# Patient Record
Sex: Female | Born: 1995 | Race: Black or African American | Hispanic: No | Marital: Single | Smoking: Never smoker
Health system: Southern US, Community
[De-identification: ages and names within clinical notes are randomized; demographics above are authoritative.]

## PROBLEM LIST (undated history)

## (undated) DIAGNOSIS — K5190 Ulcerative colitis, unspecified, without complications: Secondary | ICD-10-CM

## (undated) DIAGNOSIS — K5090 Crohn's disease, unspecified, without complications: Secondary | ICD-10-CM

## (undated) DIAGNOSIS — R519 Headache, unspecified: Secondary | ICD-10-CM

## (undated) DIAGNOSIS — R51 Headache: Secondary | ICD-10-CM

## (undated) DIAGNOSIS — G43909 Migraine, unspecified, not intractable, without status migrainosus: Secondary | ICD-10-CM

---
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
## 2016-02-22 ENCOUNTER — Encounter (HOSPITAL_BASED_OUTPATIENT_CLINIC_OR_DEPARTMENT_OTHER): Payer: Self-pay | Admitting: Emergency Medicine

## 2016-03-06 ENCOUNTER — Emergency Department (HOSPITAL_BASED_OUTPATIENT_CLINIC_OR_DEPARTMENT_OTHER): Payer: Medicaid Other

## 2016-03-06 ENCOUNTER — Emergency Department (HOSPITAL_BASED_OUTPATIENT_CLINIC_OR_DEPARTMENT_OTHER)
Admission: EM | Admit: 2016-03-06 | Discharge: 2016-03-06 | Disposition: A | Discharge status: Home / Self Care | Payer: Medicaid Other | Attending: Emergency Medicine | Admitting: Emergency Medicine

## 2016-03-06 ENCOUNTER — Encounter (HOSPITAL_BASED_OUTPATIENT_CLINIC_OR_DEPARTMENT_OTHER): Payer: Self-pay | Admitting: Emergency Medicine

## 2016-03-06 DIAGNOSIS — J0100 Acute maxillary sinusitis, unspecified: Secondary | ICD-10-CM | POA: Insufficient documentation

## 2016-03-06 DIAGNOSIS — R05 Cough: Secondary | ICD-10-CM | POA: Diagnosis present

## 2016-03-06 LAB — PREGNANCY, URINE: Preg Test, Ur: NEGATIVE

## 2016-03-06 IMAGING — DX DG CHEST 2V
2 series · 2 of 2 positions shown · non-contrast
Comparison: None.

CLINICAL DATA: Cough for 4 days

EXAM:
CHEST  2 VIEW

[chest pa]
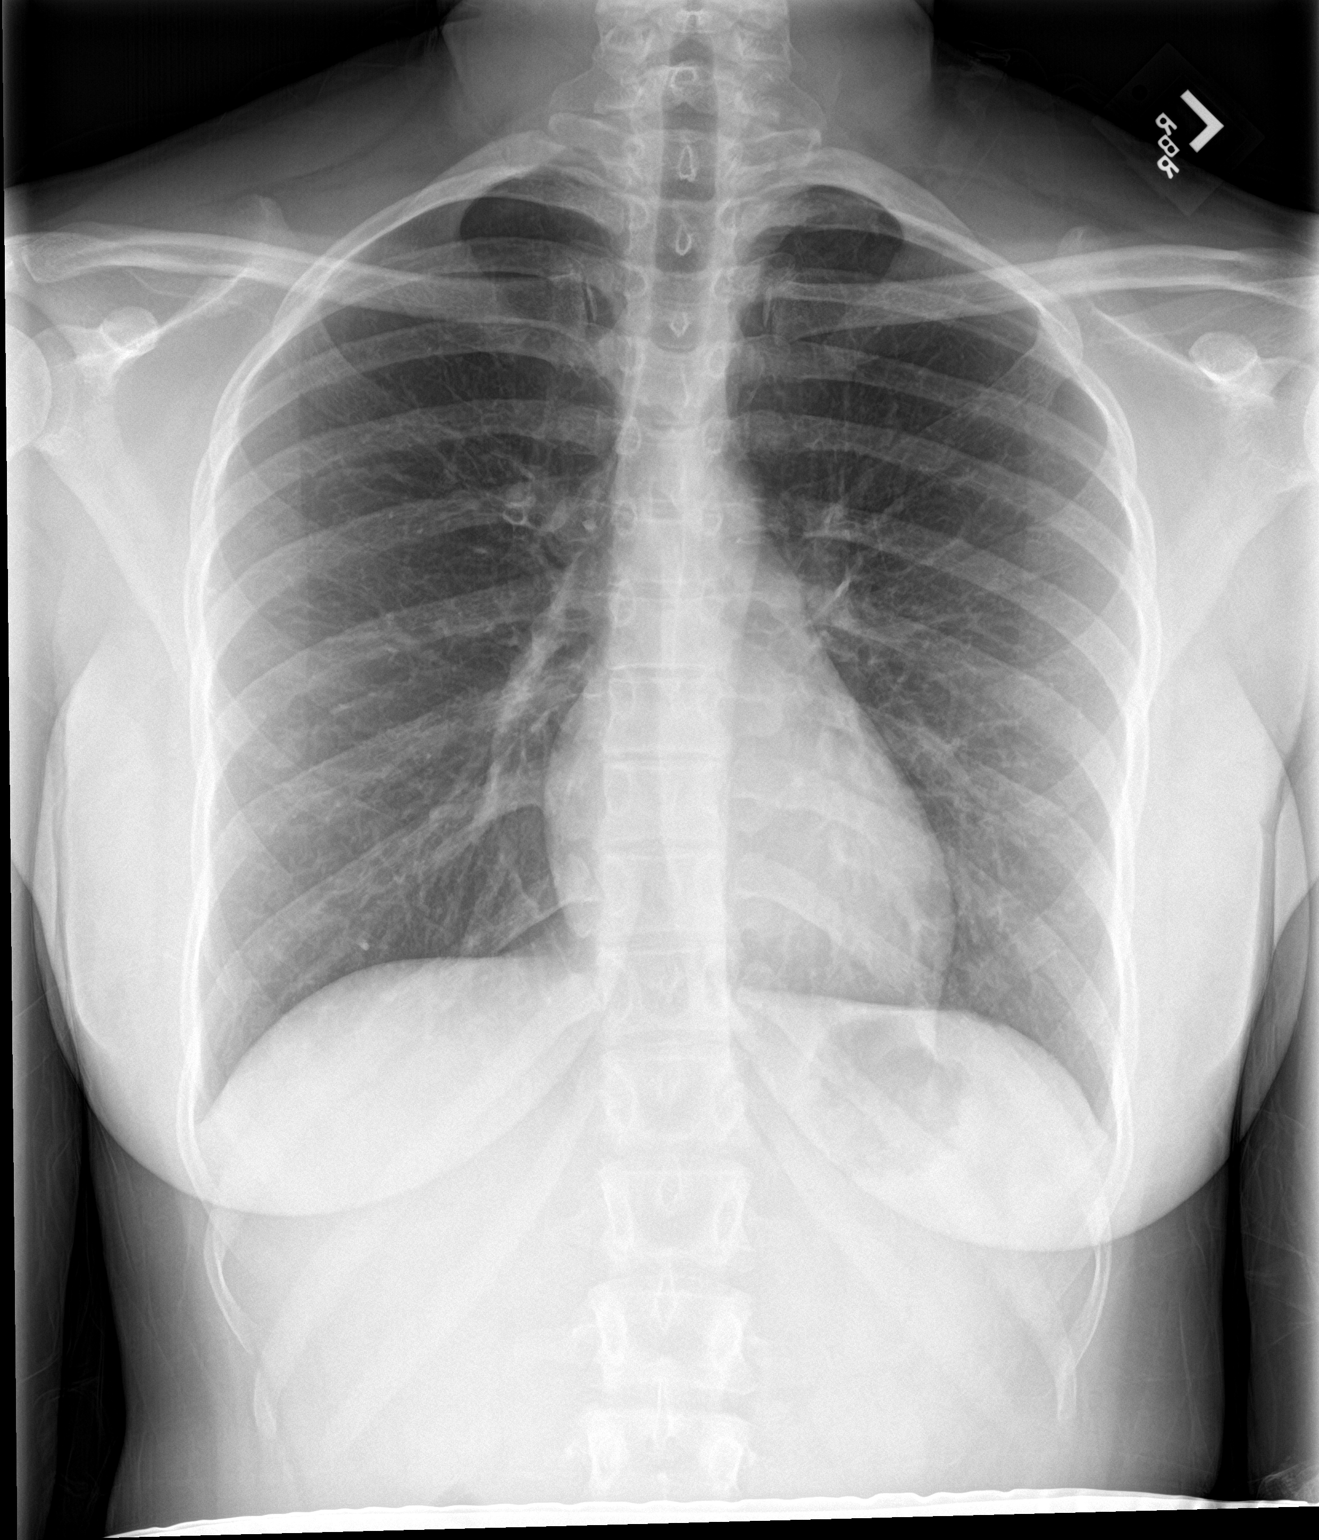

[chest lat]
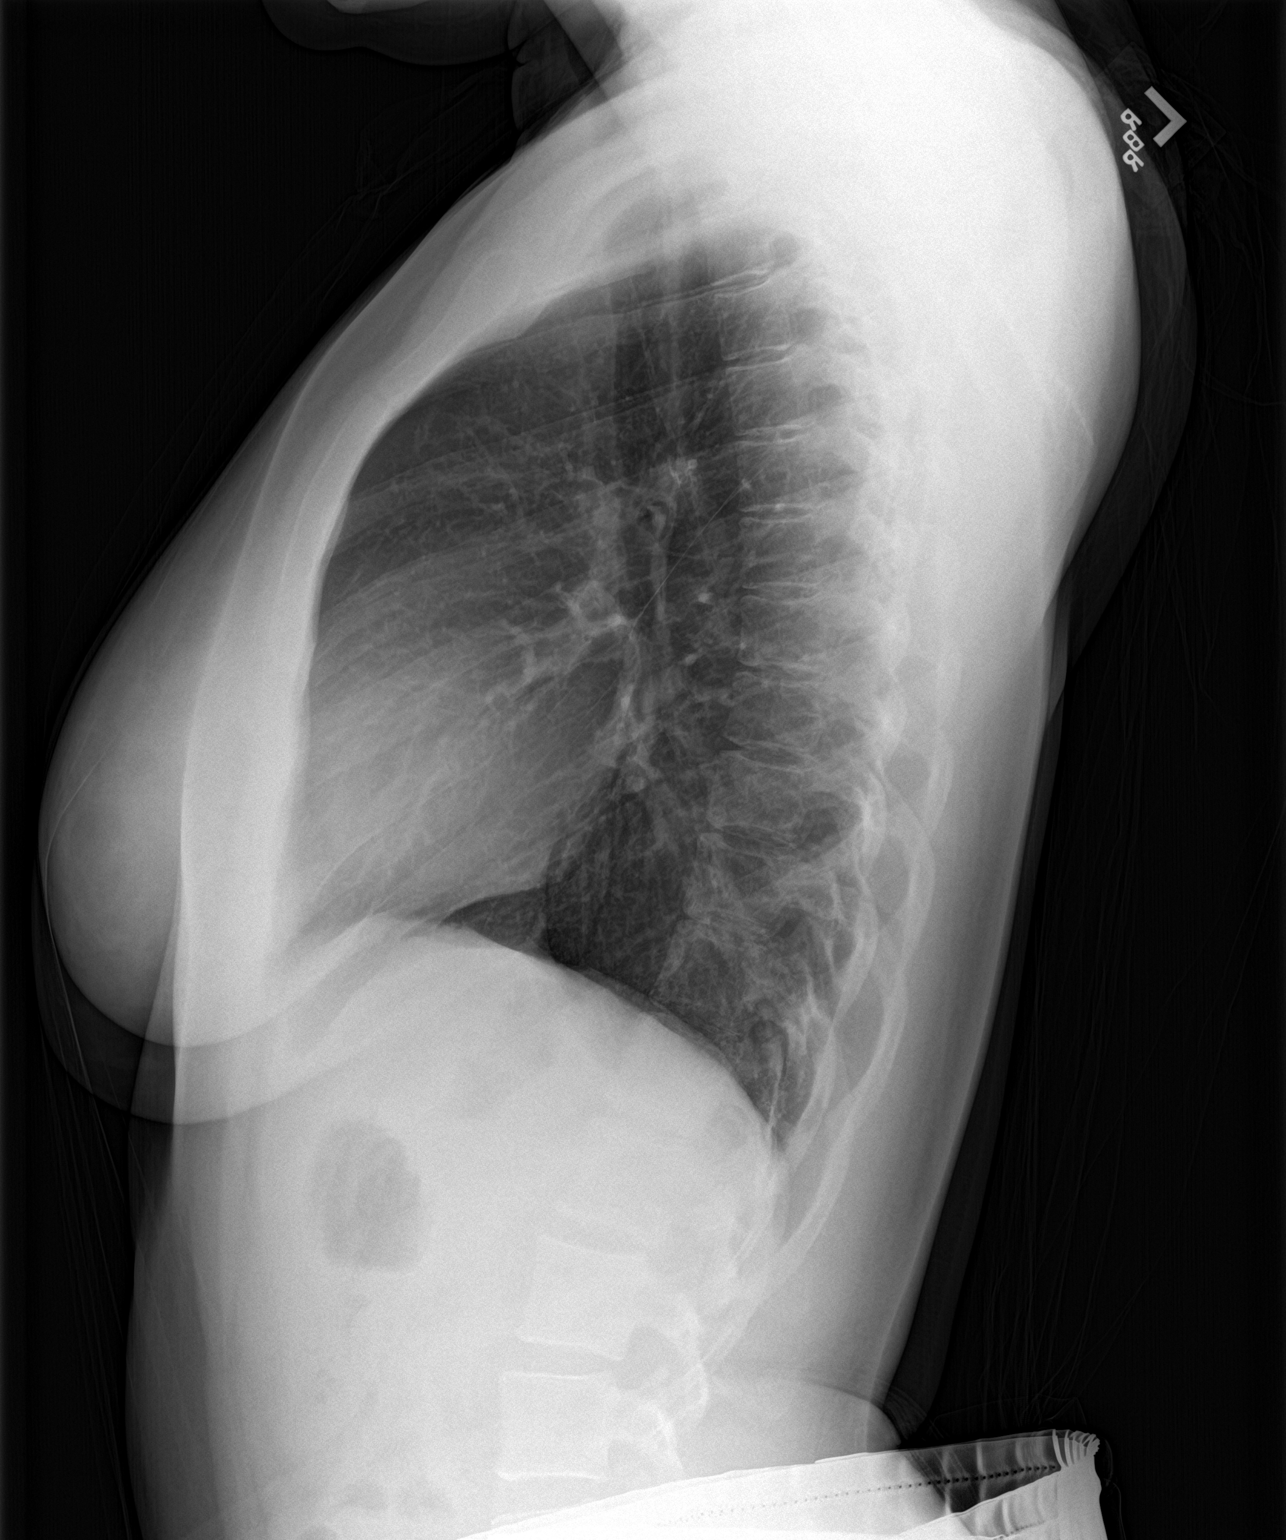

[2 of 2 positions shown; findings below may reference images not displayed]

FINDINGS: Lungs are clear. Heart size and pulmonary vascularity are normal. No
adenopathy. No bone lesions.
IMPRESSION: No edema or consolidation.

## 2016-03-06 MED ORDER — CEPHALEXIN 500 MG PO CAPS: 500.0000 mg | ORAL_CAPSULE | Freq: Three times a day (TID) | ORAL | 0 refills | Status: DC

## 2016-03-06 NOTE — ED Provider Notes (Signed)
[facility] DEPT [facility] Provider Note   CSN: [id number] Arrival date & time: 03/06/16  1126     History   Chief Complaint Chief Complaint  Patient presents with  . Cough    HPI Robin Fuller is a 20 y.o. female history Crohn's disease but not on any medicines here presenting with cough, congestion, sinus drainage. Patient states that she has been having greenish sinus drainage and sinus pressure for the last several days. Has occasional headaches is worse at night. She states that her family sick with similar symptoms as well. Denies any fevers or chills. Also has some productive cough as well. She is not using birth control and doesn't know if she is pregnant or not but denies any abdominal pain or urinary symptoms.   The history is provided by the patient.    Past Medical History:  Diagnosis Date  . Crohn's disease ([place])   . Ulcerative colitis ([place])     There are no active problems to display for this patient.   No past surgical history on file.  OB History    No data available       Home Medications    Prior to Admission medications   Not on File    Family History No family history on file.  Social History Social History  Substance Use Topics  . Smoking status: Never Smoker  . Smokeless tobacco: Never Used  . Alcohol use No     Allergies   Patient has no known allergies.   Review of Systems Review of Systems  Respiratory: Positive for cough.   All other systems reviewed and are negative.    Physical Exam Updated Vital Signs BP 123/76 (BP Location: Right Arm)   Pulse 97   Temp 98 F (36.7 C) (Oral)   Resp 16   Ht 5' 4"  (1.626 m)   Wt 132 lb 8 oz (60.1 kg)   SpO2 99%   BMI 22.74 kg/m   Physical Exam  Constitutional: She is oriented to person, place, and time. She appears well-developed.  HENT:  Head: Normocephalic.  + maxillary sinus tenderness.   Eyes: Pupils are equal, round, and reactive to light.  Neck: Normal  range of motion. Neck supple.  Cardiovascular: Normal rate.   Pulmonary/Chest: Effort normal and breath sounds normal. No respiratory distress. She has no wheezes.  Abdominal: Soft. Bowel sounds are normal. She exhibits no distension. There is no tenderness.  Musculoskeletal: Normal range of motion.  Neurological: She is alert and oriented to person, place, and time. No cranial nerve deficit. Coordination normal.  Skin: Skin is warm.  Psychiatric: She has a normal mood and affect.  Nursing note and vitals reviewed.    ED Treatments / Results  Labs (all labs ordered are listed, but only abnormal results are displayed) Labs Reviewed  PREGNANCY, URINE    EKG  EKG Interpretation None       Radiology Dg Chest 2 View  Result Date: 03/06/2016 CLINICAL DATA:  Cough for 4 days EXAM: CHEST  2 VIEW COMPARISON:  None. FINDINGS: Lungs are clear. Heart size and pulmonary vascularity are normal. No adenopathy. No bone lesions. IMPRESSION: No edema or consolidation. Electronically Signed   By: Lowella Grip III M.D.   On: 03/06/2016 12:35    Procedures Procedures (including critical care time)  Medications Ordered in ED Medications - No data to display   Initial Impression / Assessment and Plan / ED Course  I have reviewed the triage vital signs  and the nursing notes.  Pertinent labs & imaging results that were available during my care of the patient were reviewed by me and considered in my medical decision making (see chart for details).  Clinical Course     Robin Fuller is a 20 y.o. female here with sinus congestion, cough. Vitals stable. Well appearing. Likely sinusitis vs pneumonia. Will get CXR.   1:20 PM CXR nl. UCG neg. She has medicaid so will give keflex for sinusitis.      Final Clinical Impressions(s) / ED Diagnoses   Final diagnoses:  None    New Prescriptions New Prescriptions   No medications on file     Drenda Freeze, MD 03/06/16  1320

## 2016-03-06 NOTE — ED Triage Notes (Signed)
Pt states she has uri symptoms since this weekend, irritation of ears and throat.  Some nausea.  No vomiting.  No diarrhea.  No fever.  Productive green sputum.

## 2016-03-06 NOTE — ED Notes (Signed)
Pt verbalized understanding of discharge instructions and denies any further questions at this time.   

## 2016-03-06 NOTE — Discharge Instructions (Signed)
Take keflex three times daily for a week.   Take tylenol, motrin for pain or fever.  See your doctor.   Return to ER if you have worse congestion, fever, cough.

## 2016-06-10 ENCOUNTER — Encounter (HOSPITAL_BASED_OUTPATIENT_CLINIC_OR_DEPARTMENT_OTHER): Payer: Self-pay | Admitting: *Deleted

## 2016-06-10 ENCOUNTER — Emergency Department (HOSPITAL_BASED_OUTPATIENT_CLINIC_OR_DEPARTMENT_OTHER)
Admission: EM | Admit: 2016-06-10 | Discharge: 2016-06-10 | Disposition: A | Discharge status: Home / Self Care | Payer: Medicaid Other | Attending: Emergency Medicine | Admitting: Emergency Medicine

## 2016-06-10 DIAGNOSIS — G43909 Migraine, unspecified, not intractable, without status migrainosus: Secondary | ICD-10-CM | POA: Insufficient documentation

## 2016-06-10 HISTORY — DX: Headache, unspecified: R51.9

## 2016-06-10 HISTORY — DX: Migraine, unspecified, not intractable, without status migrainosus: G43.909

## 2016-06-10 HISTORY — DX: Headache: R51

## 2016-06-10 MED ORDER — METOCLOPRAMIDE HCL 10 MG PO TABS
10.0000 mg | ORAL_TABLET | Freq: Once | ORAL | Status: AC
  Administered 2016-06-10: 10 mg via ORAL
  Filled 2016-06-10: qty 1

## 2016-06-10 MED ORDER — DEXAMETHASONE SODIUM PHOSPHATE 10 MG/ML IJ SOLN
10.0000 mg | Freq: Once | INTRAMUSCULAR | Status: AC
  Administered 2016-06-10: 10 mg via INTRAMUSCULAR
  Filled 2016-06-10: qty 1

## 2016-06-10 NOTE — ED Triage Notes (Addendum)
Here for "migraines". Gets both HAs and migraines. This migraine described as "head pumping and light sensitive, with intermittent nausea", onset this morning while driving to work, denies other sx, no meds PTA. States, "has Crohn's disease and therefore cannot have OTC meds".Alert, NAD, calm, interactive, resps e/u, speaking in clear complete sentences, no dyspnea noted, skin W&D. Family at [facility]. 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

## 2016-06-10 NOTE — ED Notes (Signed)
ED Provider at bedside. 

## 2016-06-10 NOTE — ED Provider Notes (Signed)
[facility] Provider Note   CSN: [id number] Arrival date & time: 06/10/16  2042   By signing my name below, I, Freida Busman, attest that this documentation has been prepared under the direction and in the presence of Tomasita Crumble, MD . Electronically Signed: Freida Busman, Scribe. 06/10/2016. 11:18 PM. History   Chief Complaint Chief Complaint  Patient presents with  . Migraine    The history is provided by the patient. No language interpreter was used.    HPI Comments:  Robin Fuller is a 21 y.o. female with a history of migraines, who presents to the Emergency Department complaining of a HA with pain behind her R eye which she has had for several hours. Her pain began while driving. Pt also notes her pain was preceded by nausea and symptoms today are typical of her migraine HAs. Her pain is exacerbated by light. She denies weakness in her extremities. No head injury/trauma. No alleviating factors noted. She states she has been evaluated in the past at [facility] and states they gave her "a shot of hydrocodone in her butt" which provided relief.  Past Medical History:  Diagnosis Date  . Crohn's disease ([facility])   . Frequent headaches   . Migraine   . Ulcerative colitis ([facility])     There are no active problems to display for this patient.   History reviewed. No pertinent surgical history.  OB History    No data available       Home Medications    Prior to Admission medications   Medication Sig Start Date End Date Taking? Authorizing Provider  cephALEXin (KEFLEX) 500 MG capsule Take 1 capsule (500 mg total) by mouth 3 (three) times daily. 03/06/16   Charlynne Pander, MD    Family History History reviewed. No pertinent family history.  Social History Social History  Substance Use Topics  . Smoking status: Never Smoker  . Smokeless tobacco: Never Used  . Alcohol use No     Allergies   Patient has no known allergies.   Review of  Systems Review of Systems 10 systems reviewed and all are negative for acute change except as noted in the HPI.   Physical Exam Updated Vital Signs BP 118/66 (BP Location: Left Arm)   Pulse 80   Temp 98.5 F (36.9 C) (Oral)   Resp 18   Ht 5\' 4"  (1.626 m)   Wt 135 lb (61.2 kg)   SpO2 99%   BMI 23.17 kg/m   Physical Exam  Constitutional: She is oriented to person, place, and time. She appears well-developed and well-nourished. No distress.  HENT:  Head: Normocephalic and atraumatic.  Nose: Nose normal.  Mouth/Throat: Oropharynx is clear and moist. No oropharyngeal exudate.  Eyes: Conjunctivae and EOM are normal. Pupils are equal, round, and reactive to light. No scleral icterus.  Neck: Normal range of motion. Neck supple. No JVD present. No tracheal deviation present. No thyromegaly present.  Cardiovascular: Normal rate, regular rhythm and normal heart sounds.  Exam reveals no gallop and no friction rub.   No murmur heard. Pulmonary/Chest: Effort normal and breath sounds normal. No respiratory distress. She has no wheezes. She exhibits no tenderness.  Abdominal: Soft. Bowel sounds are normal. She exhibits no distension and no mass. There is no tenderness. There is no rebound and no guarding.  Musculoskeletal: Normal range of motion. She exhibits no edema or tenderness.  Lymphadenopathy:    She has no cervical adenopathy.  Neurological: She is alert  and oriented to person, place, and time. No cranial nerve deficit. She exhibits normal muscle tone.  Normal strength and sensation to all extremities Normal cerebellar testing  Skin: Skin is warm and dry. No rash noted. No erythema. No pallor.  Nursing note and vitals reviewed.    ED Treatments / Results  DIAGNOSTIC STUDIES:  Oxygen Saturation is 99% on TA, normal by my interpretation.    COORDINATION OF CARE:  11:17 PM Discussed treatment plan with pt at bedside and pt agreed to plan.  Labs (all labs ordered are listed,  but only abnormal results are displayed) Labs Reviewed - No data to display  EKG  EKG Interpretation None       Radiology No results found.  Procedures Procedures (including critical care time)  Medications Ordered in ED Medications - No data to display   Initial Impression / Assessment and Plan / ED Course  I have reviewed the triage vital signs and the nursing notes.  Pertinent labs & imaging results that were available during my care of the patient were reviewed by me and considered in my medical decision making (see chart for details).      Patient presents to the ED for migraine, typical of her normal migraines.  Her history is not concerning for intracranial emergency.  There was no acute onset of her symptoms.  Upon review of care everywhere, she was given IM decadron.  Will give here in the ED as well as oral reglan.  Advised to fu with PCP within 3 days for further management.  She appears well and in NAD. VS remain within her normal limits and she is safe for DC.   Final Clinical Impressions(s) / ED Diagnoses   Final diagnoses:  None    New Prescriptions New Prescriptions   No medications on file     I personally performed the services described in this documentation, which was scribed in my presence. The recorded information has been reviewed and is accurate.       Tomasita Crumble, MD 06/10/16 2351

## 2018-12-17 ENCOUNTER — Emergency Department
Admission: EM | Admit: 2018-12-17 | Discharge: 2018-12-17 | Discharge status: Absent without leave | Payer: Self-pay | Source: Home / Self Care

## 2018-12-17 ENCOUNTER — Other Ambulatory Visit: Payer: Self-pay

## 2020-04-12 ENCOUNTER — Other Ambulatory Visit: Payer: Self-pay

## 2020-04-12 ENCOUNTER — Emergency Department (HOSPITAL_BASED_OUTPATIENT_CLINIC_OR_DEPARTMENT_OTHER)
Admission: EM | Admit: 2020-04-12 | Discharge: 2020-04-13 | Discharge status: Against Medical Advice | Payer: Medicaid Other | Attending: Emergency Medicine | Admitting: Emergency Medicine

## 2020-04-12 ENCOUNTER — Encounter (HOSPITAL_BASED_OUTPATIENT_CLINIC_OR_DEPARTMENT_OTHER): Payer: Self-pay | Admitting: Emergency Medicine

## 2020-04-12 DIAGNOSIS — O98513 Other viral diseases complicating pregnancy, third trimester: Secondary | ICD-10-CM | POA: Insufficient documentation

## 2020-04-12 DIAGNOSIS — O26893 Other specified pregnancy related conditions, third trimester: Secondary | ICD-10-CM | POA: Diagnosis present

## 2020-04-12 DIAGNOSIS — Z3A36 36 weeks gestation of pregnancy: Secondary | ICD-10-CM | POA: Insufficient documentation

## 2020-04-12 DIAGNOSIS — U071 COVID-19: Secondary | ICD-10-CM | POA: Diagnosis not present

## 2020-04-12 DIAGNOSIS — R109 Unspecified abdominal pain: Secondary | ICD-10-CM

## 2020-04-12 MED ORDER — ACETAMINOPHEN 325 MG PO TABS
650.0000 mg | ORAL_TABLET | Freq: Once | ORAL | Status: AC
  Administered 2020-04-12: 650 mg via ORAL
  Filled 2020-04-12: qty 2

## 2020-04-12 MED ORDER — SODIUM CHLORIDE 0.9 % IV BOLUS
1000.0000 mL | Freq: Once | INTRAVENOUS | Status: AC
  Administered 2020-04-12: 1000 mL via INTRAVENOUS

## 2020-04-12 NOTE — ED Triage Notes (Signed)
Pt with contractions intermittently since 1900.

## 2020-04-12 NOTE — ED Provider Notes (Signed)
[facility] EMERGENCY DEPARTMENT Provider Note   CSN: [id number] Arrival date & time: 04/12/20  2245     History Chief Complaint  Patient presents with  . Laboring    Robin Fuller is a 25 y.o. female.  Pt complains of lower abdominal pain.  Pt is 36.6.  Pt reports she has been coughing and she had fluid leak.  Pt reports she went to [facility] and was told there was a long wait.  Pt is concerned that she is in labor.  Pt is a G1. Pt had a negative covid test earlier in the week.    The history is provided by the patient. No language interpreter was used.       Past Medical History:  Diagnosis Date  . Crohn's disease ([place])   . Frequent headaches   . Migraine   . Ulcerative colitis ([place])     There are no problems to display for this patient.   History reviewed. No pertinent surgical history.   OB History   No obstetric history on file.     No family history on file.  Social History   Tobacco Use  . Smoking status: Never Smoker  . Smokeless tobacco: Never Used  Substance Use Topics  . Alcohol use: No  . Drug use: No    Home Medications Prior to Admission medications   Not on File    Allergies    Patient has no known allergies.  Review of Systems   Review of Systems  All other systems reviewed and are negative.   Physical Exam Updated Vital Signs BP 121/77   Pulse (!) 111   Temp 98.2 F (36.8 C) (Oral)   Resp 18   SpO2 100%   Physical Exam Vitals and nursing note reviewed.  Constitutional:      Appearance: She is well-developed and well-nourished.  HENT:     Head: Normocephalic.  Eyes:     Extraocular Movements: EOM normal.  Cardiovascular:     Rate and Rhythm: Normal rate.  Pulmonary:     Effort: Pulmonary effort is normal.  Abdominal:     General: There is no distension.     Comments: 36 week,   Genitourinary:    Comments: Cervix high 1 cm.  No obvious fluid Musculoskeletal:        General: Normal range of  motion.     Cervical back: Normal range of motion.  Skin:    General: Skin is warm.  Neurological:     General: No focal deficit present.     Mental Status: She is alert and oriented to person, place, and time.  Psychiatric:        Mood and Affect: Mood and affect and mood normal.     ED Results / Procedures / Treatments   Labs (all labs ordered are listed, but only abnormal results are displayed) Labs Reviewed  RESP PANEL BY RT-PCR (FLU A&B, COVID) ARPGX2    EKG None  Radiology No results found.  Procedures Procedures (including critical care time)  Medications Ordered in ED Medications  sodium chloride 0.9 % bolus 1,000 mL (has no administration in time range)    ED Course  I have reviewed the triage vital signs and the nursing notes.  Pertinent labs & imaging results that were available during my care of the patient were reviewed by me and considered in my medical decision making (see chart for details).    MDM Rules/Calculators/A&P  Pt given IV fluids x 1 liter of fluids,  I spoke to Dr. Elgie Congo who accepts pt to [facility] to evaluate for labor/rupture.  Covid ordered due to cough  Pt's covid test is positive.   Rapid response rn reports pt is not contracting.  Pt decided she does not want to [facility].  Pt chose to sign out ama.   Final Clinical Impression(s) / ED Diagnoses Final diagnoses:  [redacted] weeks gestation of pregnancy  Abdominal pain, unspecified abdominal location    Rx / DC Orders ED Discharge Orders    None       Sidney Ace 04/13/20 0030    Margette Fast, MD 04/13/20 [phone]

## 2020-04-12 NOTE — Progress Notes (Signed)
Dr. Donavan Foil notified about pt and per Dr. Donavan Foil pt would need to be evaluated to rule out ROM. Pt can come to [facility] for eval or since she receives care at [facility] she can go see her provider if she would like.

## 2020-04-12 NOTE — ED Notes (Signed)
Pt has a cough x 2 days. Pt states she took a negative home covid test.

## 2020-04-12 NOTE — Progress Notes (Signed)
OB [facility] called Georgina Peer, [facility] RN to let her know what Dr. Elgie Congo said about patients plan of care.

## 2020-04-12 NOTE — Progress Notes (Signed)
[facility] ED RN called [facility] RN about 37 week G1P0 that reports contractions that began around 7pm. Pt reports positive fetal movement. Pt reports possible loss of fluid, she isn't sure. She reports she coughed and felt some fluid leaking. Pt receives care from [facility]. Per ER PA pt is 1cm dilated. Pt placed on external monitor and will continue to monitor.

## 2020-04-12 NOTE — ED Notes (Signed)
[facility] OB nurse contacted.

## 2020-04-13 LAB — RESP PANEL BY RT-PCR (FLU A&B, COVID) ARPGX2
Influenza A by PCR: NEGATIVE
Influenza B by PCR: NEGATIVE
SARS Coronavirus 2 by RT PCR: POSITIVE — AB

## 2020-04-13 NOTE — ED Notes (Signed)
Pt has now decided she doesn't want to be transferred to [facility] and wishes to sign out AMA

## 2020-04-13 NOTE — ED Notes (Signed)
Received call from [facility] OB stating pt can come off monitor. No contractions seen on monitor.

## 2020-04-13 NOTE — Progress Notes (Signed)
Monitors removed due to pt being transfer to [facility] for eval.

## 2020-04-14 ENCOUNTER — Telehealth: Payer: Self-pay

## 2020-04-14 NOTE — Telephone Encounter (Signed)
Called to discuss with patient about COVID-19 symptoms and the use of one of the available treatments for those with mild to moderate Covid symptoms and at a high risk of hospitalization.  Pt appears to qualify for outpatient treatment due to co-morbid conditions and/or a member of an at-risk group in accordance with the FDA Emergency Use Authorization.    Symptom onset: Unknown Vaccinated: Unknown Booster? Unknown Immunocompromised? No Qualifiers: None  Unable to reach pt - Called number x 2 , will not complete.   Marcello Moores

## 2020-08-07 ENCOUNTER — Other Ambulatory Visit: Payer: Self-pay

## 2020-08-07 ENCOUNTER — Emergency Department (HOSPITAL_BASED_OUTPATIENT_CLINIC_OR_DEPARTMENT_OTHER)
Admission: EM | Admit: 2020-08-07 | Discharge: 2020-08-08 | Disposition: A | Discharge status: Home / Self Care | Payer: Medicaid Other | Attending: Emergency Medicine | Admitting: Emergency Medicine

## 2020-08-07 ENCOUNTER — Encounter (HOSPITAL_BASED_OUTPATIENT_CLINIC_OR_DEPARTMENT_OTHER): Payer: Self-pay | Admitting: *Deleted

## 2020-08-07 DIAGNOSIS — B349 Viral infection, unspecified: Secondary | ICD-10-CM | POA: Insufficient documentation

## 2020-08-07 DIAGNOSIS — R0602 Shortness of breath: Secondary | ICD-10-CM | POA: Diagnosis present

## 2020-08-07 DIAGNOSIS — U071 COVID-19: Secondary | ICD-10-CM | POA: Diagnosis not present

## 2020-08-07 MED ORDER — OXYCODONE HCL 5 MG PO TABS
5.0000 mg | ORAL_TABLET | Freq: Once | ORAL | Status: AC
  Administered 2020-08-07: 5 mg via ORAL
  Filled 2020-08-07: qty 1

## 2020-08-07 NOTE — ED Provider Notes (Signed)
[facility]in [facility] Provider Note   CSN: [id number] Arrival date & time: 08/07/20  2043     History No chief complaint on file.   Robin Fuller is a 25 y.o. female.  States that she cannot take Tylenol or ibuprofen because of her Crohn's disease.  The history is provided by the patient.  Influenza Presenting symptoms: cough, fever, headache, myalgias, shortness of breath and sore throat   Presenting symptoms: no vomiting   Severity:  Moderate Onset quality:  Sudden Duration:  1 day Progression:  Unchanged Chronicity:  New Relieved by:  Nothing Worsened by:  Nothing Associated symptoms: chills and nasal congestion   Associated symptoms: no ear pain   Risk factors comment:  Crohn's disease and ulcerative colitis, in remission. Unvaccinated for COVID-19      Past Medical History:  Diagnosis Date  . Crohn's disease ([place])   . Frequent headaches   . Migraine   . Ulcerative colitis ([place])     There are no problems to display for this patient.   History reviewed. No pertinent surgical history.   OB History   No obstetric history on file.     No family history on file.  Social History   Tobacco Use  . Smoking status: Never Smoker  . Smokeless tobacco: Never Used  Substance Use Topics  . Alcohol use: No  . Drug use: No    Home Medications Prior to Admission medications   Not on File    Allergies    Patient has no known allergies.  Review of Systems   Review of Systems  Constitutional: Positive for chills and fever.  HENT: Positive for congestion and sore throat. Negative for ear pain.   Eyes: Negative for pain and visual disturbance.  Respiratory: Positive for cough and shortness of breath.   Cardiovascular: Negative for chest pain and palpitations.  Gastrointestinal: Negative for abdominal pain and vomiting.  Genitourinary: Negative for dysuria and hematuria.  Musculoskeletal: Positive for myalgias. Negative for  arthralgias and back pain.  Skin: Negative for color change and rash.  Neurological: Positive for headaches. Negative for seizures and syncope.  All other systems reviewed and are negative.   Physical Exam Updated Vital Signs BP 116/85 (BP Location: Left Arm)   Pulse (!) 105   Temp (!) 101.8 F (38.8 C) (Oral)   Resp 20   Ht 5' 4"  (1.626 m)   Wt 68 kg   SpO2 100%   BMI 25.75 kg/m   Physical Exam Vitals and nursing note reviewed.  Constitutional:      General: She is not in acute distress.    Appearance: She is well-developed.  HENT:     Head: Normocephalic and atraumatic.  Eyes:     Conjunctiva/sclera: Conjunctivae normal.  Cardiovascular:     Rate and Rhythm: Normal rate and regular rhythm.     Heart sounds: No murmur heard.   Pulmonary:     Effort: Pulmonary effort is normal. No respiratory distress.     Breath sounds: Normal breath sounds.  Musculoskeletal:     Cervical back: Neck supple.  Skin:    General: Skin is warm and dry.  Neurological:     General: No focal deficit present.     Mental Status: She is alert.  Psychiatric:        Mood and Affect: Mood normal.     ED Results / Procedures / Treatments   Labs (all labs ordered are listed, but only abnormal results are  displayed) Labs Reviewed  RESP PANEL BY RT-PCR (FLU A&B, COVID) ARPGX2    EKG None  Radiology No results found.  Procedures Procedures   Medications Ordered in ED Medications  oxyCODONE (Oxy IR/ROXICODONE) immediate release tablet 5 mg (has no administration in time range)    ED Course  I have reviewed the triage vital signs and the nursing notes.  Pertinent labs & imaging results that were available during my care of the patient were reviewed by me and considered in my medical decision making (see chart for details).    MDM Rules/Calculators/A&P                          Robin Fuller presents with a viral illness.  She has fever and upper respiratory symptoms.   She is unvaccinated for COVID-19.  No discrete sick contacts.  She does have a history of Crohn's disease and ulcerative colitis which puts her at a higher risk category.  She also has a 71-monthold baby.  She would like to stay and wait for her results.  She declined Tylenol and ibuprofen but did request medication for pain.  She would want to be treated for either COVID or flu with positive for 1 or the other. Final Clinical Impression(s) / ED Diagnoses Final diagnoses:  Viral illness    Rx / DC Orders ED Discharge Orders    None       WArnaldo Natal MD 08/07/20 2325

## 2020-08-07 NOTE — ED Notes (Signed)
Tylenol and Motrin offered but refused due to hx of UC.

## 2020-08-07 NOTE — ED Triage Notes (Addendum)
Sore throat, cough and chills this am. Pt feels like she has Covid.

## 2020-08-08 LAB — RESP PANEL BY RT-PCR (FLU A&B, COVID) ARPGX2
Influenza A by PCR: NEGATIVE
Influenza B by PCR: NEGATIVE
SARS Coronavirus 2 by RT PCR: POSITIVE — AB

## 2020-08-08 MED ORDER — NIRMATRELVIR/RITONAVIR (PAXLOVID)TABLET: ORAL_TABLET | ORAL | 0 refills | Status: DC

## 2020-08-08 NOTE — Discharge Instructions (Addendum)
You were seen today for viral symptoms.  You tested positive for COVID-19.  Take Paxil of it as directed.  You need to isolate for at least 5 days.  If you are still symptomatic, you should continue to isolate.

## 2020-08-08 NOTE — ED Provider Notes (Signed)
  Patient signed out pending viral testing.  She is positive for COVID-19.  I informed her of these results.  She is satting 99% on room air and is overall nontoxic-appearing.  She is interested in Paxlovid given her history of inflammatory bowel disease.  She also has a young child at home.  This was sent to her pharmacy.  We discussed isolation.  After history, exam, and medical workup I feel the patient has been appropriately medically screened and is safe for discharge home. Pertinent diagnoses were discussed with the patient. Patient was given return precautions.  Robin Fuller was evaluated in Emergency Department on 08/08/2020 for the symptoms described in the history of present illness. She was evaluated in the context of the global COVID-19 pandemic, which necessitated consideration that the patient might be at risk for infection with the SARS-CoV-2 virus that causes COVID-19. Institutional protocols and algorithms that pertain to the evaluation of patients at risk for COVID-19 are in a state of rapid change based on information released by regulatory bodies including the CDC and federal and state organizations. These policies and algorithms were followed during the patient's care in the ED.   Physical Exam  BP (!) 106/91 (BP Location: Right Arm)   Pulse (!) 105   Temp 99.2 F (37.3 C) (Oral)   Resp 18   Ht 1.626 m (5' 4" )   Wt 68 kg   SpO2 99%   BMI 25.75 kg/m   Problem List Items Addressed This Visit   None   Visit Diagnoses    Viral illness    -  Primary   Relevant Medications   nirmatrelvir/ritonavir EUA (PAXLOVID) TABS   COVID-19       Relevant Medications   nirmatrelvir/ritonavir EUA (PAXLOVID) TABS           Armina Galloway, Barbette Hair, MD 08/08/20 [phone]

## 2020-10-07 ENCOUNTER — Emergency Department (HOSPITAL_BASED_OUTPATIENT_CLINIC_OR_DEPARTMENT_OTHER): Payer: Medicaid Other

## 2020-10-07 ENCOUNTER — Encounter (HOSPITAL_BASED_OUTPATIENT_CLINIC_OR_DEPARTMENT_OTHER): Payer: Self-pay | Admitting: Emergency Medicine

## 2020-10-07 ENCOUNTER — Emergency Department (HOSPITAL_BASED_OUTPATIENT_CLINIC_OR_DEPARTMENT_OTHER)
Admission: EM | Admit: 2020-10-07 | Discharge: 2020-10-07 | Disposition: A | Discharge status: Home / Self Care | Payer: Medicaid Other | Attending: Emergency Medicine | Admitting: Emergency Medicine

## 2020-10-07 ENCOUNTER — Other Ambulatory Visit: Payer: Self-pay

## 2020-10-07 DIAGNOSIS — K921 Melena: Secondary | ICD-10-CM | POA: Diagnosis not present

## 2020-10-07 DIAGNOSIS — R109 Unspecified abdominal pain: Secondary | ICD-10-CM | POA: Diagnosis present

## 2020-10-07 DIAGNOSIS — K529 Noninfective gastroenteritis and colitis, unspecified: Secondary | ICD-10-CM | POA: Diagnosis not present

## 2020-10-07 LAB — CBC WITH DIFFERENTIAL/PLATELET
Abs Immature Granulocytes: 0.04 K/uL (ref 0.00–0.07)
Basophils Absolute: 0 K/uL (ref 0.0–0.1)
Basophils Relative: 0 %
Eosinophils Absolute: 0.2 K/uL (ref 0.0–0.5)
Eosinophils Relative: 2 %
HCT: 40.2 % (ref 36.0–46.0)
Hemoglobin: 12.4 g/dL (ref 12.0–15.0)
Immature Granulocytes: 0 %
Lymphocytes Relative: 13 %
Lymphs Abs: 1.2 K/uL (ref 0.7–4.0)
MCH: 23.1 pg — ABNORMAL LOW (ref 26.0–34.0)
MCHC: 30.8 g/dL (ref 30.0–36.0)
MCV: 74.9 fL — ABNORMAL LOW (ref 80.0–100.0)
Monocytes Absolute: 0.8 K/uL (ref 0.1–1.0)
Monocytes Relative: 9 %
Neutro Abs: 7.2 K/uL (ref 1.7–7.7)
Neutrophils Relative %: 76 %
Platelets: 398 K/uL (ref 150–400)
RBC: 5.37 MIL/uL — ABNORMAL HIGH (ref 3.87–5.11)
RDW: 18.3 % — ABNORMAL HIGH (ref 11.5–15.5)
WBC: 9.5 K/uL (ref 4.0–10.5)
nRBC: 0 % (ref 0.0–0.2)

## 2020-10-07 LAB — PREGNANCY, URINE: Preg Test, Ur: NEGATIVE

## 2020-10-07 LAB — URINALYSIS, ROUTINE W REFLEX MICROSCOPIC
Bilirubin Urine: NEGATIVE
Glucose, UA: NEGATIVE mg/dL
Ketones, ur: NEGATIVE mg/dL
Leukocytes,Ua: NEGATIVE
Nitrite: NEGATIVE
Protein, ur: NEGATIVE mg/dL
Specific Gravity, Urine: 1.025 (ref 1.005–1.030)
pH: 5.5 (ref 5.0–8.0)

## 2020-10-07 LAB — COMPREHENSIVE METABOLIC PANEL
ALT: 39 U/L (ref 0–44)
AST: 21 U/L (ref 15–41)
Albumin: 4 g/dL (ref 3.5–5.0)
Alkaline Phosphatase: 107 U/L (ref 38–126)
Anion gap: 7 (ref 5–15)
BUN: 7 mg/dL (ref 6–20)
CO2: 23 mmol/L (ref 22–32)
Calcium: 8.6 mg/dL — ABNORMAL LOW (ref 8.9–10.3)
Chloride: 108 mmol/L (ref 98–111)
Creatinine, Ser: 0.74 mg/dL (ref 0.44–1.00)
GFR, Estimated: >60 mL/min (ref >60)
Glucose, Bld: 104 mg/dL — ABNORMAL HIGH (ref 70–99)
Potassium: 4 mmol/L (ref 3.5–5.1)
Sodium: 138 mmol/L (ref 135–145)
Total Bilirubin: 0.4 mg/dL (ref 0.3–1.2)
Total Protein: 7.5 g/dL (ref 6.5–8.1)

## 2020-10-07 LAB — URINALYSIS, MICROSCOPIC (REFLEX)

## 2020-10-07 LAB — C-REACTIVE PROTEIN: CRP: 3 mg/dL — ABNORMAL HIGH (ref <1.0)

## 2020-10-07 LAB — SEDIMENTATION RATE: Sed Rate: 6 mm/hr (ref 0–22)

## 2020-10-07 IMAGING — CT CT ABD-PELV W/ CM
2 of 4 series · 15 of 46 positions shown, 17 images · IV contrast (Omnipaque)
Comparison: No priors.

CLINICAL DATA: 25-year-old female with history of acute onset of
nonlocalized abdominal pain. Patient has inflammatory bowel disease.

EXAM:
CT ABDOMEN AND PELVIS WITH CONTRAST
TECHNIQUE: Multidetector CT imaging of the abdomen and pelvis was performed
using the standard protocol following bolus administration of
intravenous contrast.
CONTRAST:  100mL OMNIPAQUE IOHEXOL 300 MG/ML  SOLN

[Series 2: axial st · axial · 0.89mm/px · z∈[+539,+944]mm · 12 of 93 slices shown, 14 images]
[im 8/93  soft-tissue]
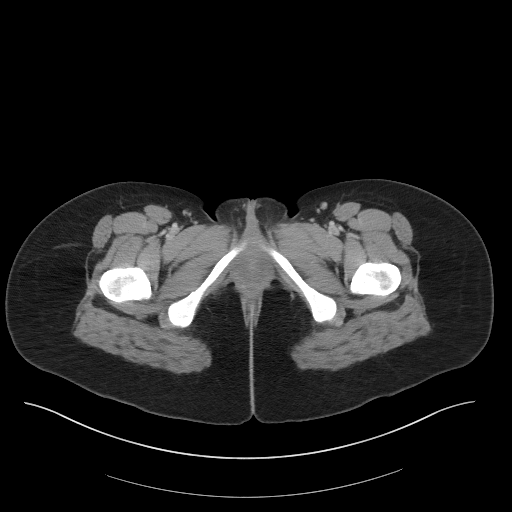
[im 8/93  bone]
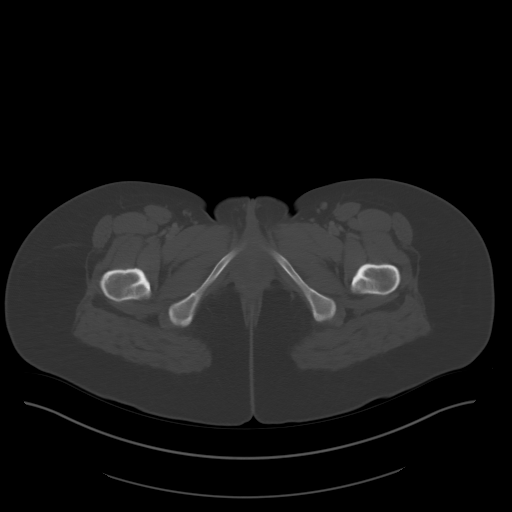
[im 15/93  soft-tissue]
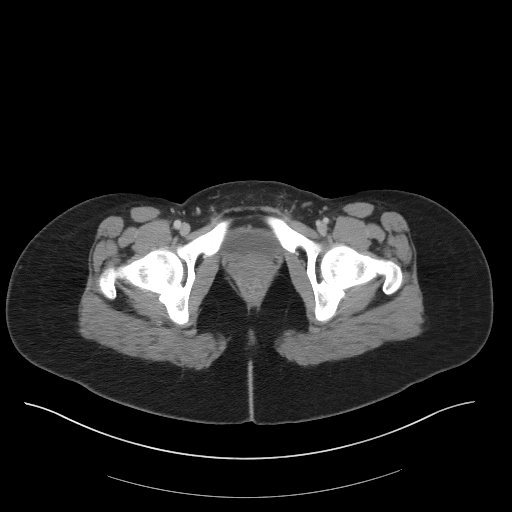
[im 23/93  soft-tissue]
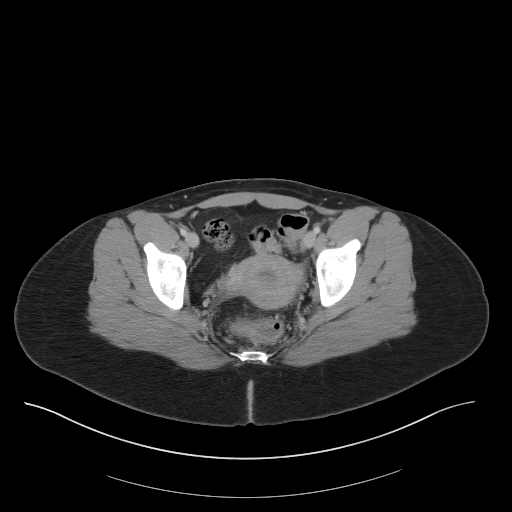
[im 30/93  soft-tissue]
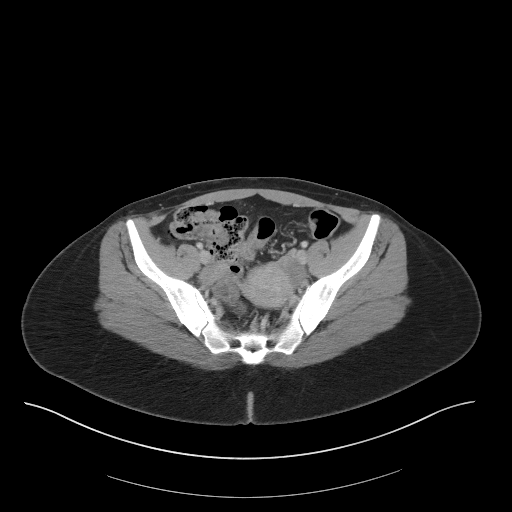
[im 37/93  soft-tissue]
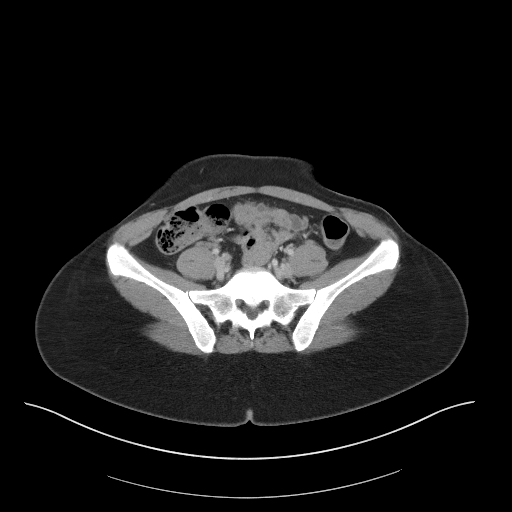
[im 45/93  soft-tissue]
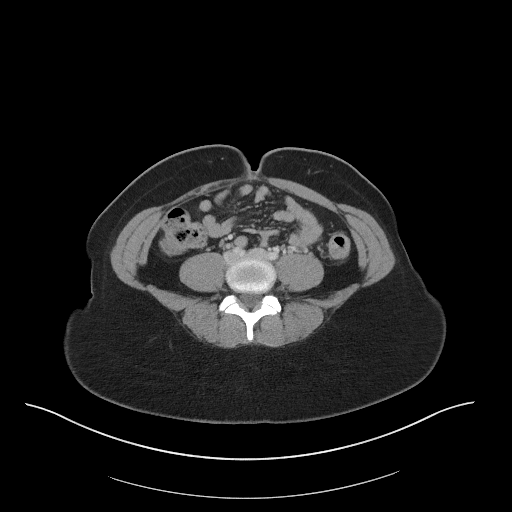
[im 52/93  soft-tissue]
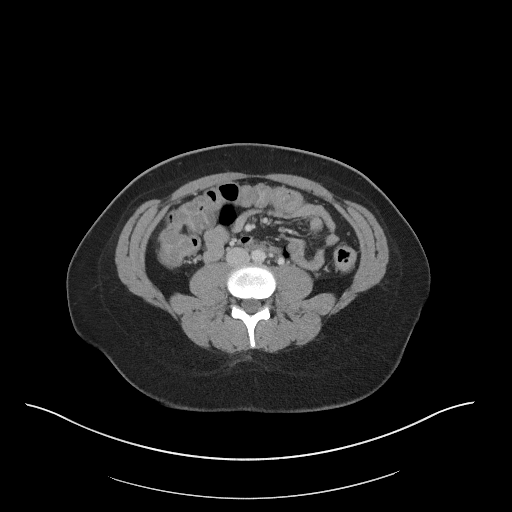
[im 59/93  soft-tissue]
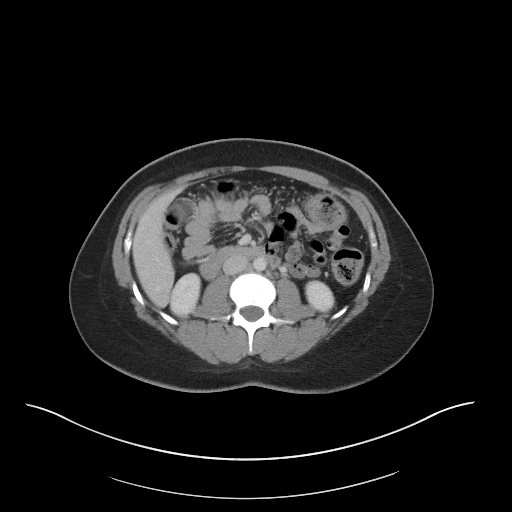
[im 67/93  soft-tissue]
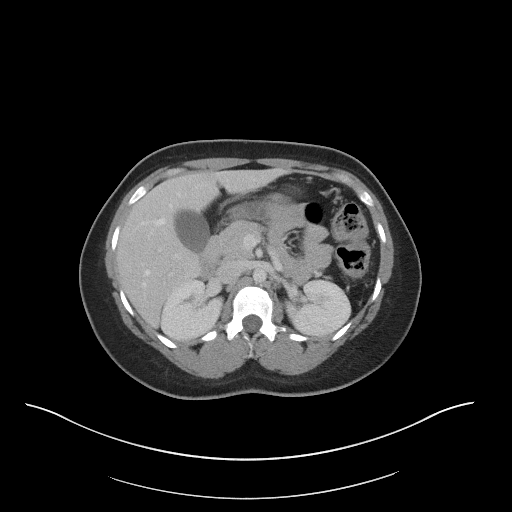
[im 67/93  bone]
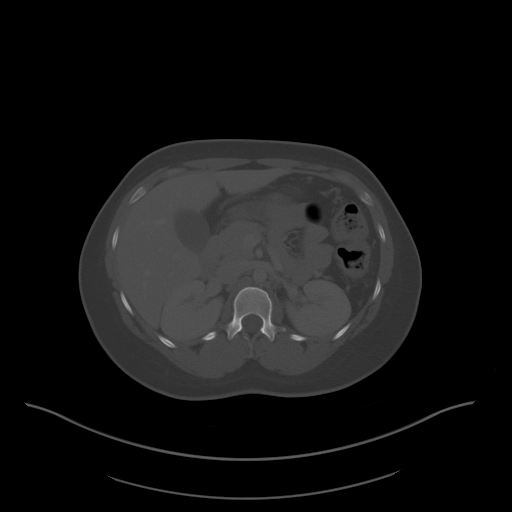
[im 74/93  soft-tissue]
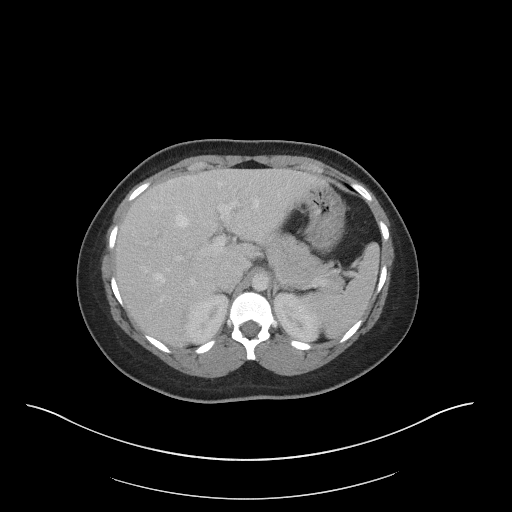
[im 81/93  soft-tissue]
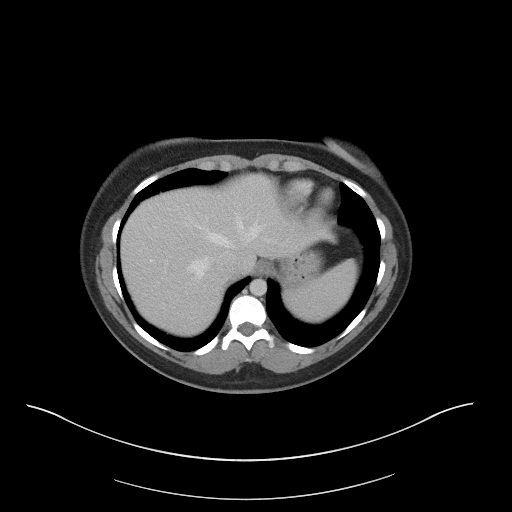
[im 89/93  soft-tissue]
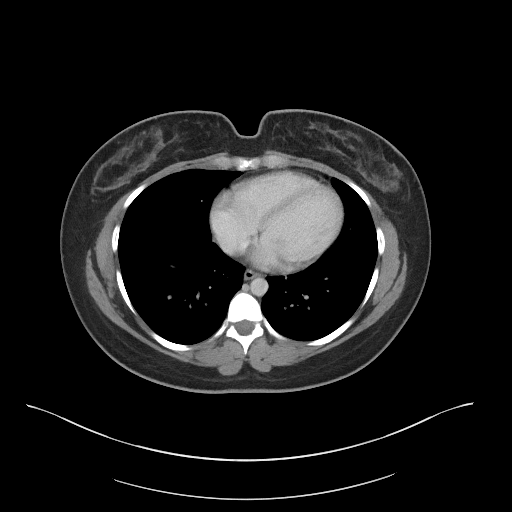

[Series 5: coronal st · coronal · 0.69mm/px · 3 of 101 slices shown]
[im 34/101  soft-tissue]
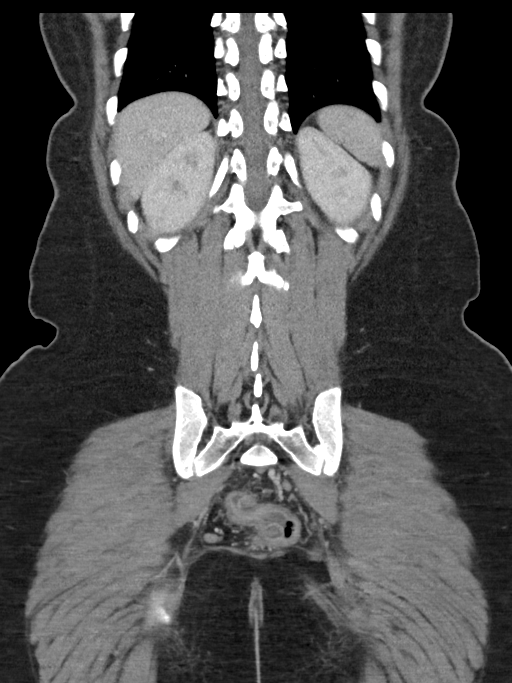
[im 45/101  soft-tissue]
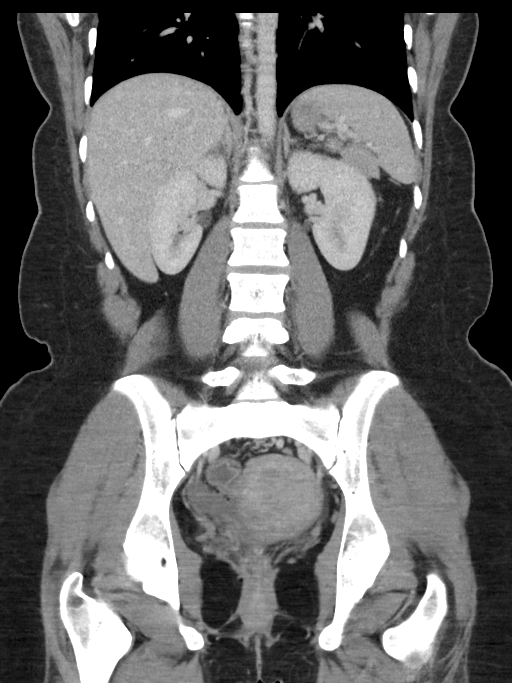
[im 56/101  soft-tissue]
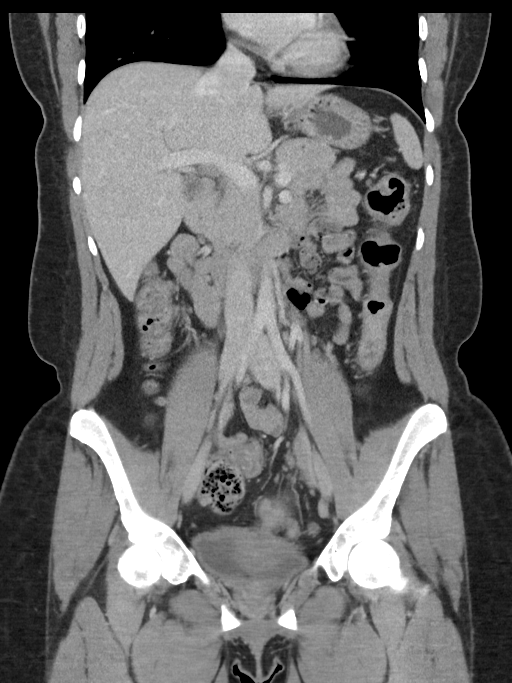

[15 of 46 positions shown; findings below may reference images not displayed]

FINDINGS: Lower chest: Unremarkable.

Hepatobiliary: No suspicious cystic or solid hepatic lesions. No
intra or extrahepatic biliary ductal dilatation. Gallbladder is
normal in appearance.

Pancreas: No pancreatic mass. No pancreatic ductal dilatation. No
pancreatic or peripancreatic fluid collections or inflammatory
changes.

Spleen: Unremarkable.

Adrenals/Urinary Tract: Bilateral kidneys and bilateral adrenal
glands are normal in appearance. No hydroureteronephrosis. Urinary
bladder is normal in appearance.

Stomach/Bowel: Normal appearance of the stomach. No pathologic
dilatation of small bowel or colon. There appears to be some mild
mural thickening and mucosal hyperenhancement in the rectum where
there is also some very mild surrounding haziness in the mesorectal
fat and some prominent mesorectal vessels and prominent but
nonenlarged mesorectal lymph nodes, which could indicate active
disease in this patient with history of ulcerative colitis. Similar
findings are present to a lesser extent involving more proximal
aspects of the colon to the level of the transverse colon. Terminal
ileum is grossly normal in appearance. Normal appendix.

Vascular/Lymphatic: No significant atherosclerotic disease, aneurysm
or dissection noted in the abdominal or pelvic vasculature. No
lymphadenopathy noted in the abdomen or pelvis.

Reproductive: Uterus and ovaries are unremarkable in appearance.

Other: No significant volume of ascites.  No pneumoperitoneum.

Musculoskeletal: There are no aggressive appearing lytic or blastic
lesions noted in the visualized portions of the skeleton.
IMPRESSION: 1. There are findings suggestive of active disease involving the
rectum and colon proximally to the level of the transverse colon in
this patient with history of ulcerative colitis, as above. No
complicating features are identified.

## 2020-10-07 MED ORDER — CIPROFLOXACIN IN D5W 400 MG/200ML IV SOLN
400.0000 mg | Freq: Once | INTRAVENOUS | Status: AC
  Administered 2020-10-07: 400 mg via INTRAVENOUS
  Filled 2020-10-07: qty 200

## 2020-10-07 MED ORDER — CIPROFLOXACIN HCL 500 MG PO TABS: 500.0000 mg | ORAL_TABLET | Freq: Two times a day (BID) | ORAL | 0 refills | Status: DC

## 2020-10-07 MED ORDER — LACTATED RINGERS IV BOLUS
1000.0000 mL | Freq: Once | INTRAVENOUS | Status: AC
  Administered 2020-10-07: 1000 mL via INTRAVENOUS

## 2020-10-07 MED ORDER — OXYCODONE-ACETAMINOPHEN 5-325 MG PO TABS: 1.0000 | ORAL_TABLET | Freq: Four times a day (QID) | ORAL | 0 refills | Status: DC | PRN

## 2020-10-07 MED ORDER — METHYLPREDNISOLONE SODIUM SUCC 125 MG IJ SOLR
125.0000 mg | Freq: Once | INTRAMUSCULAR | Status: AC
  Administered 2020-10-07: 125 mg via INTRAVENOUS
  Filled 2020-10-07: qty 2

## 2020-10-07 MED ORDER — PREDNISONE 20 MG PO TABS: ORAL_TABLET | ORAL | 0 refills | Status: DC

## 2020-10-07 MED ORDER — IOHEXOL 300 MG/ML  SOLN
100.0000 mL | Freq: Once | INTRAMUSCULAR | Status: AC | PRN
  Administered 2020-10-07: 100 mL via INTRAVENOUS

## 2020-10-07 MED ORDER — HYDROMORPHONE HCL 1 MG/ML IJ SOLN
0.5000 mg | Freq: Once | INTRAMUSCULAR | Status: AC
  Administered 2020-10-07: 0.5 mg via INTRAVENOUS
  Filled 2020-10-07: qty 1

## 2020-10-07 MED ORDER — HYDROMORPHONE HCL 1 MG/ML IJ SOLN
1.0000 mg | Freq: Once | INTRAMUSCULAR | Status: AC
  Administered 2020-10-07: 1 mg via INTRAVENOUS
  Filled 2020-10-07: qty 1

## 2020-10-07 MED ORDER — KETOROLAC TROMETHAMINE 30 MG/ML IJ SOLN
15.0000 mg | Freq: Once | INTRAMUSCULAR | Status: AC
  Administered 2020-10-07: 15 mg via INTRAVENOUS
  Filled 2020-10-07: qty 1

## 2020-10-07 NOTE — ED Triage Notes (Signed)
Pt states she has crohn's disease and ulcerative colitis and has had rectal bleeding all week but states it has been worse the past two nights  Pt is also c/o lower abdominal pain

## 2020-10-07 NOTE — ED Provider Notes (Signed)
[facility] Provider Note   CSN: [id number] Arrival date & time: 10/07/20  0325     History Chief Complaint  Patient presents with   Abdominal Pain    [facility] is a 25 y.o. female.  H/o IBD, possibly UC vs Crohn's disease (unclear based on outside records) here with a week or so of abdominal pain associated with hematochezia. Similar to previous episodes. Usually improves with steroids and other symptomatic treatment. Hasn't tried anything for symptoms yet. Will follow up with GI in a couple days.    Abdominal Pain     Past Medical History:  Diagnosis Date   Crohn's disease ([place])    Frequent headaches    Migraine    Ulcerative colitis ([place])     There are no problems to display for this patient.   History reviewed. No pertinent surgical history.   OB History   No obstetric history on file.     History reviewed. No pertinent family history.  Social History   Tobacco Use   Smoking status: Never   Smokeless tobacco: Never  Vaping Use   Vaping Use: Never used  Substance Use Topics   Alcohol use: No   Drug use: No    Home Medications Prior to Admission medications   Medication Sig Start Date End Date Taking? Authorizing Provider  oxyCODONE-acetaminophen (PERCOCET) 5-325 MG tablet Take 1-2 tablets by mouth every 6 (six) hours as needed. 10/07/20  Yes Bernice Mullin, Corene Cornea, MD  predniSONE (DELTASONE) 20 MG tablet 3 tabs po daily x 3 days, then 2 tabs x 3 days, then 1.5 tabs x 3 days, then 1 tab x 3 days, then 0.5 tabs x 3 days 10/07/20  Yes Kasey Hansell, Corene Cornea, MD  ciprofloxacin (CIPRO) 500 MG tablet Take 1 tablet (500 mg total) by mouth 2 (two) times daily. 10/07/20   Maegan Buller, Corene Cornea, MD  nirmatrelvir/ritonavir EUA (PAXLOVID) TABS Patient GFR is >90. Take nirmatrelvir (150 mg) 2 tablet(s) twice daily for 5 days and ritonavir (100 mg) one tablet twice daily for 5 days. 08/08/20   Horton, Barbette Hair, MD    Allergies    Patient has no  known allergies.  Review of Systems   Review of Systems  Gastrointestinal:  Positive for abdominal pain.  All other systems reviewed and are negative.  Physical Exam Updated Vital Signs BP 121/62 (BP Location: Left Arm)   Pulse 99   Temp 98.5 F (36.9 C) (Oral)   Resp 16   Ht 5' 4"  (1.626 m)   Wt 68 kg   SpO2 100%   BMI 25.75 kg/m   Physical Exam Vitals and nursing note reviewed.  Constitutional:      Appearance: She is well-developed.  HENT:     Head: Normocephalic and atraumatic.  Cardiovascular:     Rate and Rhythm: Normal rate and regular rhythm.  Pulmonary:     Effort: No respiratory distress.     Breath sounds: No stridor.  Abdominal:     General: There is no distension.     Tenderness: There is generalized abdominal tenderness. There is no guarding or rebound.  Musculoskeletal:     Cervical back: Normal range of motion.  Neurological:     Mental Status: She is alert.    ED Results / Procedures / Treatments   Labs (all labs ordered are listed, but only abnormal results are displayed) Labs Reviewed  CBC WITH DIFFERENTIAL/PLATELET - Abnormal; Notable for the following components:      Result  Value   RBC 5.37 (*)    MCV 74.9 (*)    MCH 23.1 (*)    RDW 18.3 (*)    All other components within normal limits  COMPREHENSIVE METABOLIC PANEL - Abnormal; Notable for the following components:   Glucose, Bld 104 (*)    Calcium 8.6 (*)    All other components within normal limits  URINALYSIS, ROUTINE W REFLEX MICROSCOPIC - Abnormal; Notable for the following components:   Hgb urine dipstick TRACE (*)    All other components within normal limits  URINALYSIS, MICROSCOPIC (REFLEX) - Abnormal; Notable for the following components:   Bacteria, UA FEW (*)    All other components within normal limits  SEDIMENTATION RATE  PREGNANCY, URINE  C-REACTIVE PROTEIN    EKG None  Radiology CT ABDOMEN PELVIS W CONTRAST  Result Date: 10/07/2020 CLINICAL DATA:  25 year old  female with history of acute onset of nonlocalized abdominal pain. Patient has inflammatory bowel disease. EXAM: CT ABDOMEN AND PELVIS WITH CONTRAST TECHNIQUE: Multidetector CT imaging of the abdomen and pelvis was performed using the standard protocol following bolus administration of intravenous contrast. CONTRAST:  150m OMNIPAQUE IOHEXOL 300 MG/ML  SOLN COMPARISON:  No priors. FINDINGS: Lower chest: Unremarkable. Hepatobiliary: No suspicious cystic or solid hepatic lesions. No intra or extrahepatic biliary ductal dilatation. Gallbladder is normal in appearance. Pancreas: No pancreatic mass. No pancreatic ductal dilatation. No pancreatic or peripancreatic fluid collections or inflammatory changes. Spleen: Unremarkable. Adrenals/Urinary Tract: Bilateral kidneys and bilateral adrenal glands are normal in appearance. No hydroureteronephrosis. Urinary bladder is normal in appearance. Stomach/Bowel: Normal appearance of the stomach. No pathologic dilatation of small bowel or colon. There appears to be some mild mural thickening and mucosal hyperenhancement in the rectum where there is also some very mild surrounding haziness in the mesorectal fat and some prominent mesorectal vessels and prominent but nonenlarged mesorectal lymph nodes, which could indicate active disease in this patient with history of ulcerative colitis. Similar findings are present to a lesser extent involving more proximal aspects of the colon to the level of the transverse colon. Terminal ileum is grossly normal in appearance. Normal appendix. Vascular/Lymphatic: No significant atherosclerotic disease, aneurysm or dissection noted in the abdominal or pelvic vasculature. No lymphadenopathy noted in the abdomen or pelvis. Reproductive: Uterus and ovaries are unremarkable in appearance. Other: No significant volume of ascites.  No pneumoperitoneum. Musculoskeletal: There are no aggressive appearing lytic or blastic lesions noted in the visualized  portions of the skeleton. IMPRESSION: 1. There are findings suggestive of active disease involving the rectum and colon proximally to the level of the transverse colon in this patient with history of ulcerative colitis, as above. No complicating features are identified. Electronically Signed   By: DVinnie LangtonM.D.   On: 10/07/2020 05:37    Procedures Procedures   Medications Ordered in ED Medications  ciprofloxacin (CIPRO) IVPB 400 mg (400 mg Intravenous New Bag/Given 10/07/20 0607)  lactated ringers bolus 1,000 mL ( Intravenous Stopped 10/07/20 0606)  methylPREDNISolone sodium succinate (SOLU-MEDROL) 125 mg/2 mL injection 125 mg (125 mg Intravenous Given 10/07/20 0433)  HYDROmorphone (DILAUDID) injection 0.5 mg (0.5 mg Intravenous Given 10/07/20 0429)  iohexol (OMNIPAQUE) 300 MG/ML solution 100 mL (100 mLs Intravenous Contrast Given 10/07/20 0522)  HYDROmorphone (DILAUDID) injection 1 mg (1 mg Intravenous Given 10/07/20 0558)  ketorolac (TORADOL) 30 MG/ML injection 15 mg (15 mg Intravenous Given 10/07/20 0604)    ED Course  I have reviewed the triage vital signs and the nursing notes.  Pertinent labs & imaging results that were available during my care of the patient were reviewed by me and considered in my medical decision making (see chart for details).    MDM Rules/Calculators/A&P                         Eval for IBD complications.  Symptoms improved. Will dc on abx/steroid taper/percocet pending GI follow up. No indication for tranfusion or admission at this time as symptoms seem to be fairly well controlled, has good insight and good follow up.   Final Clinical Impression(s) / ED Diagnoses Final diagnoses:  Inflammatory bowel disease    Rx / DC Orders ED Discharge Orders          Ordered    ciprofloxacin (CIPRO) 500 MG tablet  2 times daily,   Status:  Discontinued        10/07/20 0636    predniSONE (DELTASONE) 20 MG tablet        10/07/20 0636    oxyCODONE-acetaminophen  (PERCOCET) 5-325 MG tablet  Every 6 hours PRN        10/07/20 0636    ciprofloxacin (CIPRO) 500 MG tablet  2 times daily        10/07/20 0639             Dieter Hane, Corene Cornea, MD 10/07/20 [phone]

## 2020-10-25 ENCOUNTER — Other Ambulatory Visit: Payer: Self-pay

## 2020-10-25 ENCOUNTER — Emergency Department (HOSPITAL_BASED_OUTPATIENT_CLINIC_OR_DEPARTMENT_OTHER)
Admission: EM | Admit: 2020-10-25 | Discharge: 2020-10-25 | Disposition: A | Discharge status: Home / Self Care | Payer: Medicaid Other | Attending: Emergency Medicine | Admitting: Emergency Medicine

## 2020-10-25 ENCOUNTER — Encounter (HOSPITAL_BASED_OUTPATIENT_CLINIC_OR_DEPARTMENT_OTHER): Payer: Self-pay | Admitting: Emergency Medicine

## 2020-10-25 DIAGNOSIS — R1084 Generalized abdominal pain: Secondary | ICD-10-CM | POA: Diagnosis present

## 2020-10-25 DIAGNOSIS — N9489 Other specified conditions associated with female genital organs and menstrual cycle: Secondary | ICD-10-CM | POA: Diagnosis not present

## 2020-10-25 DIAGNOSIS — Z20822 Contact with and (suspected) exposure to covid-19: Secondary | ICD-10-CM | POA: Insufficient documentation

## 2020-10-25 DIAGNOSIS — K5090 Crohn's disease, unspecified, without complications: Secondary | ICD-10-CM

## 2020-10-25 DIAGNOSIS — K921 Melena: Secondary | ICD-10-CM | POA: Diagnosis not present

## 2020-10-25 LAB — URINALYSIS, ROUTINE W REFLEX MICROSCOPIC
Bilirubin Urine: NEGATIVE
Glucose, UA: NEGATIVE mg/dL
Ketones, ur: NEGATIVE mg/dL
Nitrite: NEGATIVE
Protein, ur: NEGATIVE mg/dL
Specific Gravity, Urine: 1.015 (ref 1.005–1.030)
pH: 6.5 (ref 5.0–8.0)

## 2020-10-25 LAB — CBC WITH DIFFERENTIAL/PLATELET
Abs Immature Granulocytes: 0.03 10*3/uL (ref 0.00–0.07)
Basophils Absolute: 0 10*3/uL (ref 0.0–0.1)
Basophils Relative: 0 %
Eosinophils Absolute: 0.2 10*3/uL (ref 0.0–0.5)
Eosinophils Relative: 2 %
HCT: 38.1 % (ref 36.0–46.0)
Hemoglobin: 11.8 g/dL — ABNORMAL LOW (ref 12.0–15.0)
Immature Granulocytes: 0 %
Lymphocytes Relative: 43 %
Lymphs Abs: 3.4 10*3/uL (ref 0.7–4.0)
MCH: 23.4 pg — ABNORMAL LOW (ref 26.0–34.0)
MCHC: 31 g/dL (ref 30.0–36.0)
MCV: 75.6 fL — ABNORMAL LOW (ref 80.0–100.0)
Monocytes Absolute: 1.2 10*3/uL — ABNORMAL HIGH (ref 0.1–1.0)
Monocytes Relative: 14 %
Neutro Abs: 3.4 10*3/uL (ref 1.7–7.7)
Neutrophils Relative %: 41 %
Platelets: 538 10*3/uL — ABNORMAL HIGH (ref 150–400)
RBC: 5.04 MIL/uL (ref 3.87–5.11)
RDW: 18.5 % — ABNORMAL HIGH (ref 11.5–15.5)
WBC: 8.3 10*3/uL (ref 4.0–10.5)
nRBC: 0 % (ref 0.0–0.2)

## 2020-10-25 LAB — LIPASE, BLOOD: Lipase: 30 U/L (ref 11–51)

## 2020-10-25 LAB — COMPREHENSIVE METABOLIC PANEL
ALT: 47 U/L — ABNORMAL HIGH (ref 0–44)
AST: 17 U/L (ref 15–41)
Albumin: 3.5 g/dL (ref 3.5–5.0)
Alkaline Phosphatase: 88 U/L (ref 38–126)
Anion gap: 7 (ref 5–15)
BUN: 7 mg/dL (ref 6–20)
CO2: 26 mmol/L (ref 22–32)
Calcium: 8.4 mg/dL — ABNORMAL LOW (ref 8.9–10.3)
Chloride: 104 mmol/L (ref 98–111)
Creatinine, Ser: 0.72 mg/dL (ref 0.44–1.00)
GFR, Estimated: >60 mL/min (ref >60)
Glucose, Bld: 103 mg/dL — ABNORMAL HIGH (ref 70–99)
Potassium: 3.9 mmol/L (ref 3.5–5.1)
Sodium: 137 mmol/L (ref 135–145)
Total Bilirubin: 0.3 mg/dL (ref 0.3–1.2)
Total Protein: 7 g/dL (ref 6.5–8.1)

## 2020-10-25 LAB — URINALYSIS, MICROSCOPIC (REFLEX)

## 2020-10-25 LAB — RESP PANEL BY RT-PCR (FLU A&B, COVID) ARPGX2
Influenza A by PCR: NEGATIVE
Influenza B by PCR: NEGATIVE
SARS Coronavirus 2 by RT PCR: NEGATIVE

## 2020-10-25 LAB — HCG, SERUM, QUALITATIVE: Preg, Serum: NEGATIVE

## 2020-10-25 MED ORDER — KETOROLAC TROMETHAMINE 30 MG/ML IJ SOLN
30.0000 mg | Freq: Once | INTRAMUSCULAR | Status: AC
  Administered 2020-10-25: 30 mg via INTRAVENOUS
  Filled 2020-10-25: qty 1

## 2020-10-25 MED ORDER — OXYCODONE-ACETAMINOPHEN 5-325 MG PO TABS: 1.0000 | ORAL_TABLET | Freq: Four times a day (QID) | ORAL | 0 refills | Status: DC | PRN

## 2020-10-25 MED ORDER — HYDROMORPHONE HCL 1 MG/ML IJ SOLN
1.0000 mg | Freq: Once | INTRAMUSCULAR | Status: AC
  Administered 2020-10-25: 1 mg via INTRAVENOUS
  Filled 2020-10-25: qty 1

## 2020-10-25 MED ORDER — MORPHINE SULFATE (PF) 4 MG/ML IV SOLN
4.0000 mg | Freq: Once | INTRAVENOUS | Status: AC
  Administered 2020-10-25: 4 mg via INTRAVENOUS
  Filled 2020-10-25: qty 1

## 2020-10-25 MED ORDER — ONDANSETRON HCL 4 MG/2ML IJ SOLN
4.0000 mg | Freq: Once | INTRAMUSCULAR | Status: AC
  Administered 2020-10-25: 4 mg via INTRAVENOUS
  Filled 2020-10-25: qty 2

## 2020-10-25 MED ORDER — METHYLPREDNISOLONE SODIUM SUCC 125 MG IJ SOLR
125.0000 mg | Freq: Once | INTRAMUSCULAR | Status: AC
  Administered 2020-10-25: 125 mg via INTRAVENOUS
  Filled 2020-10-25: qty 2

## 2020-10-25 MED ORDER — HYDROCODONE-ACETAMINOPHEN 5-325 MG PO TABS: 1.0000 | ORAL_TABLET | Freq: Four times a day (QID) | ORAL | 0 refills | Status: DC | PRN

## 2020-10-25 MED ORDER — PREDNISONE 20 MG PO TABS: ORAL_TABLET | ORAL | 0 refills | Status: DC

## 2020-10-25 MED ORDER — SODIUM CHLORIDE 0.9 % IV BOLUS
1000.0000 mL | Freq: Once | INTRAVENOUS | Status: AC
  Administered 2020-10-25: 1000 mL via INTRAVENOUS

## 2020-10-25 NOTE — Discharge Instructions (Addendum)
Begin taking prednisone as prescribed and hydrocodone as prescribed as needed for pain.  Follow-up with your gastroenterologist in the next few days, and return to the ER if you develop worsening pain, high fever, or other new and concerning symptoms.

## 2020-10-25 NOTE — ED Provider Notes (Signed)
[facility] Provider Note   CSN: [id number] Arrival date & time: 10/25/20  0247     History Chief Complaint  Patient presents with   Abdominal Pain    [facility] is a 25 y.o. female.  Patient is a 25 year old female with history of Crohn's disease.  She presents today with complaints of abdominal pain and bloody stool.  She describes constant pain in her abdomen that waxes and wanes.  She reports loose stool that is bloody and nonmelanotic.  She denies any fevers or chills.  Patient was seen here recently and underwent CT scan showing inflammation around the rectum and distal colon.  She was prescribed prednisone and antibiotics, however this has not helped.  She describes ongoing discomfort.  The history is provided by the spouse.  Abdominal Pain Pain location:  Generalized Pain quality: cramping   Pain radiates to:  Does not radiate Pain severity:  Moderate Onset quality:  Gradual Duration:  10 days Timing:  Constant Progression:  Worsening Chronicity:  New     Past Medical History:  Diagnosis Date   Crohn's disease ([place])    Frequent headaches    Migraine    Ulcerative colitis ([place])     There are no problems to display for this patient.   History reviewed. No pertinent surgical history.   OB History   No obstetric history on file.     History reviewed. No pertinent family history.  Social History   Tobacco Use   Smoking status: Never   Smokeless tobacco: Never  Vaping Use   Vaping Use: Never used  Substance Use Topics   Alcohol use: No   Drug use: No    Home Medications Prior to Admission medications   Medication Sig Start Date End Date Taking? Authorizing Provider  ciprofloxacin (CIPRO) 500 MG tablet Take 1 tablet (500 mg total) by mouth 2 (two) times daily. 10/07/20   Mesner, Corene Cornea, MD  nirmatrelvir/ritonavir EUA (PAXLOVID) TABS Patient GFR is >90. Take nirmatrelvir (150 mg) 2 tablet(s) twice daily for 5  days and ritonavir (100 mg) one tablet twice daily for 5 days. 08/08/20   Horton, Barbette Hair, MD  oxyCODONE-acetaminophen (PERCOCET) 5-325 MG tablet Take 1-2 tablets by mouth every 6 (six) hours as needed. 10/07/20   Mesner, Corene Cornea, MD  predniSONE (DELTASONE) 20 MG tablet 3 tabs po daily x 3 days, then 2 tabs x 3 days, then 1.5 tabs x 3 days, then 1 tab x 3 days, then 0.5 tabs x 3 days 10/07/20   Mesner, Corene Cornea, MD    Allergies    Patient has no known allergies.  Review of Systems   Review of Systems  Gastrointestinal:  Positive for abdominal pain.  All other systems reviewed and are negative.  Physical Exam Updated Vital Signs BP 100/86 (BP Location: Right Arm)   Pulse (!) 108   Temp 98.5 F (36.9 C) (Oral)   Resp 18   Ht 5' 5"  (1.651 m)   Wt 72.6 kg   SpO2 99%   BMI 26.63 kg/m   Physical Exam Vitals and nursing note reviewed.  Constitutional:      General: She is not in acute distress.    Appearance: She is well-developed. She is not diaphoretic.  HENT:     Head: Normocephalic and atraumatic.  Cardiovascular:     Rate and Rhythm: Normal rate and regular rhythm.     Heart sounds: No murmur heard.   No friction rub. No gallop.  Pulmonary:     Effort: Pulmonary effort is normal. No respiratory distress.     Breath sounds: Normal breath sounds. No wheezing.  Abdominal:     General: Bowel sounds are normal. There is no distension.     Palpations: Abdomen is soft.     Tenderness: There is generalized abdominal tenderness. There is no right CVA tenderness, left CVA tenderness, guarding or rebound.  Musculoskeletal:        General: Normal range of motion.     Cervical back: Normal range of motion and neck supple.  Skin:    General: Skin is warm and dry.  Neurological:     General: No focal deficit present.     Mental Status: She is alert and oriented to person, place, and time.    ED Results / Procedures / Treatments   Labs (all labs ordered are listed, but only abnormal  results are displayed) Labs Reviewed - No data to display  EKG None  Radiology No results found.  Procedures Procedures   Medications Ordered in ED Medications  sodium chloride 0.9 % bolus 1,000 mL (has no administration in time range)  ondansetron (ZOFRAN) injection 4 mg (has no administration in time range)  ketorolac (TORADOL) 30 MG/ML injection 30 mg (has no administration in time range)  morphine 4 MG/ML injection 4 mg (has no administration in time range)  methylPREDNISolone sodium succinate (SOLU-MEDROL) 125 mg/2 mL injection 125 mg (has no administration in time range)    ED Course  I have reviewed the triage vital signs and the nursing notes.  Pertinent labs & imaging results that were available during my care of the patient were reviewed by me and considered in my medical decision making (see chart for details).    MDM Rules/Calculators/A&P  Patient with history of Crohn's disease presenting here with ongoing abdominal pain.  She was seen here 2 weeks ago and diagnosed with a flare.  Her CT scan showed inflammation in the distal colon and rectum.  She states she is not improving despite receiving steroids.  Patient's vital signs are stable and laboratory studies are unremarkable.  She was given IV fluids, steroids, and several doses of pain medicine and is feeling somewhat better.  Due to the failed outpatient therapy, it was my plan to admit her for IV steroids and pain medication.  Prior to my speaking with the hospitalist, patient informed the nurse that she could not stay due to childcare reasons.  She will be discharged at her request with another round of steroids, pain medication, and follow-up with her GI doctor in the near future.  Final Clinical Impression(s) / ED Diagnoses Final diagnoses:  None    Rx / DC Orders ED Discharge Orders     None        Veryl Speak, MD 10/25/20 [phone]

## 2020-10-25 NOTE — ED Triage Notes (Signed)
Pt states she has ulcerative colitis and crohn's disease and her inflammation is worse  Pt states she just completed a round of steroids Tuesday morning but has not had any improvement  Pt states she has blood in her stools and is having a lot of abd pain

## 2020-10-31 ENCOUNTER — Other Ambulatory Visit: Payer: Self-pay

## 2020-10-31 ENCOUNTER — Encounter (HOSPITAL_BASED_OUTPATIENT_CLINIC_OR_DEPARTMENT_OTHER): Payer: Self-pay | Admitting: Emergency Medicine

## 2020-10-31 ENCOUNTER — Emergency Department (HOSPITAL_BASED_OUTPATIENT_CLINIC_OR_DEPARTMENT_OTHER)
Admission: EM | Admit: 2020-10-31 | Discharge: 2020-10-31 | Disposition: A | Discharge status: Home / Self Care | Payer: Medicaid Other | Attending: Emergency Medicine | Admitting: Emergency Medicine

## 2020-10-31 DIAGNOSIS — R197 Diarrhea, unspecified: Secondary | ICD-10-CM | POA: Diagnosis not present

## 2020-10-31 DIAGNOSIS — R1030 Lower abdominal pain, unspecified: Secondary | ICD-10-CM | POA: Insufficient documentation

## 2020-10-31 DIAGNOSIS — R110 Nausea: Secondary | ICD-10-CM | POA: Diagnosis not present

## 2020-10-31 DIAGNOSIS — R000 Tachycardia, unspecified: Secondary | ICD-10-CM | POA: Insufficient documentation

## 2020-10-31 LAB — COMPREHENSIVE METABOLIC PANEL
ALT: 40 U/L (ref 0–44)
AST: 15 U/L (ref 15–41)
Albumin: 3 g/dL — ABNORMAL LOW (ref 3.5–5.0)
Alkaline Phosphatase: 71 U/L (ref 38–126)
Anion gap: 6 (ref 5–15)
BUN: 9 mg/dL (ref 6–20)
CO2: 26 mmol/L (ref 22–32)
Calcium: 8.5 mg/dL — ABNORMAL LOW (ref 8.9–10.3)
Chloride: 106 mmol/L (ref 98–111)
Creatinine, Ser: 0.73 mg/dL (ref 0.44–1.00)
GFR, Estimated: >60 mL/min (ref >60)
Glucose, Bld: 105 mg/dL — ABNORMAL HIGH (ref 70–99)
Potassium: 3.7 mmol/L (ref 3.5–5.1)
Sodium: 138 mmol/L (ref 135–145)
Total Bilirubin: 0.3 mg/dL (ref 0.3–1.2)
Total Protein: 6.1 g/dL — ABNORMAL LOW (ref 6.5–8.1)

## 2020-10-31 LAB — URINALYSIS, MICROSCOPIC (REFLEX)

## 2020-10-31 LAB — CBC
HCT: 36.4 % (ref 36.0–46.0)
Hemoglobin: 11.2 g/dL — ABNORMAL LOW (ref 12.0–15.0)
MCH: 23.1 pg — ABNORMAL LOW (ref 26.0–34.0)
MCHC: 30.8 g/dL (ref 30.0–36.0)
MCV: 75.1 fL — ABNORMAL LOW (ref 80.0–100.0)
Platelets: 594 10*3/uL — ABNORMAL HIGH (ref 150–400)
RBC: 4.85 MIL/uL (ref 3.87–5.11)
RDW: 17.7 % — ABNORMAL HIGH (ref 11.5–15.5)
WBC: 9.9 10*3/uL (ref 4.0–10.5)
nRBC: 0.2 % (ref 0.0–0.2)

## 2020-10-31 LAB — LIPASE, BLOOD: Lipase: 23 U/L (ref 11–51)

## 2020-10-31 LAB — URINALYSIS, ROUTINE W REFLEX MICROSCOPIC
Bilirubin Urine: NEGATIVE
Glucose, UA: NEGATIVE mg/dL
Ketones, ur: NEGATIVE mg/dL
Leukocytes,Ua: NEGATIVE
Nitrite: NEGATIVE
Protein, ur: NEGATIVE mg/dL
Specific Gravity, Urine: <1.005 — ABNORMAL LOW (ref 1.005–1.030)
pH: 6.5 (ref 5.0–8.0)

## 2020-10-31 LAB — PREGNANCY, URINE: Preg Test, Ur: NEGATIVE

## 2020-10-31 LAB — C-REACTIVE PROTEIN: CRP: 3.4 mg/dL — ABNORMAL HIGH (ref <1.0)

## 2020-10-31 LAB — SEDIMENTATION RATE: Sed Rate: 21 mm/hr (ref 0–22)

## 2020-10-31 MED ORDER — MORPHINE SULFATE (PF) 4 MG/ML IV SOLN
4.0000 mg | Freq: Once | INTRAVENOUS | Status: DC
Start: 2020-10-31 — End: 2020-10-31

## 2020-10-31 MED ORDER — PREDNISONE 20 MG PO TABS: ORAL_TABLET | ORAL | 0 refills | Status: DC

## 2020-10-31 MED ORDER — HYDROMORPHONE HCL 1 MG/ML IJ SOLN
0.5000 mg | Freq: Once | INTRAMUSCULAR | Status: AC
  Administered 2020-10-31: 0.5 mg via INTRAVENOUS
  Filled 2020-10-31: qty 1

## 2020-10-31 MED ORDER — METHYLPREDNISOLONE SODIUM SUCC 125 MG IJ SOLR
125.0000 mg | Freq: Once | INTRAMUSCULAR | Status: AC
  Administered 2020-10-31: 125 mg via INTRAVENOUS
  Filled 2020-10-31: qty 2

## 2020-10-31 MED ORDER — ONDANSETRON 4 MG PO TBDP: 4.0000 mg | ORAL_TABLET | Freq: Three times a day (TID) | ORAL | 0 refills | Status: DC | PRN

## 2020-10-31 MED ORDER — OXYCODONE-ACETAMINOPHEN 5-325 MG PO TABS
1.0000 | ORAL_TABLET | Freq: Once | ORAL | Status: AC
  Administered 2020-10-31: 1 via ORAL
  Filled 2020-10-31: qty 1

## 2020-10-31 MED ORDER — MESALAMINE 1.2 G PO TBEC: 2.4000 g | DELAYED_RELEASE_TABLET | Freq: Every day | ORAL | 1 refills | Status: DC

## 2020-10-31 MED ORDER — LACTATED RINGERS IV BOLUS
1000.0000 mL | Freq: Once | INTRAVENOUS | Status: AC
  Administered 2020-10-31: 1000 mL via INTRAVENOUS

## 2020-10-31 MED ORDER — MESALAMINE 1.2 G PO TBEC: 2.4000 g | DELAYED_RELEASE_TABLET | Freq: Two times a day (BID) | ORAL | 0 refills | Status: DC

## 2020-10-31 NOTE — ED Provider Notes (Signed)
[facility] EMERGENCY DEPARTMENT Provider Note   CSN: [id number] Arrival date & time: 10/31/20  2376     History Chief Complaint  Patient presents with   Abdominal Pain    Robin Fuller is a 25 y.o. female.   Abdominal Pain Associated symptoms: diarrhea and nausea   Associated symptoms: no chest pain, no chills, no cough, no dysuria, no fatigue, no fever, no hematuria, no shortness of breath, no sore throat, no vaginal bleeding, no vaginal discharge and no vomiting   Patient is a 25 year old female with history of ulcerative colitis, presents for 3 weeks of persistent lower abdominal pain and hematochezia.  She has previously seen by [facility] gastroenterology for her UC.  She reports that she has been lost to follow-up over the past several years due to absence of symptoms.  Approximately 3 weeks ago, she did developed lower abdominal pain and hematochezia.  She was seen in the ED at that time.  A CT scan was obtained which did show findings suggestive of active disease involving rectum and colon.  She was started on a steroid taper and advised to follow-up with GI.  She presented to the ED again approximately 6 days ago for the same symptoms.  Again, she was given steroid therapy and advised to follow-up.  She states that her GI clinic is unable to see her until September.  She has had persistent symptoms since her last ED visit.  She denies any worsening of her symptoms.  Currently, she is on a 20 mg/day dose of prednisone.  In the past, she has treated flare symptoms with steroids only.  She has not required escalation of therapy beyond steroids.  She has not had nausea or vomiting.  She has been able to tolerate p.o. intake.  Her pain is located in her lower abdomen.  This pain has not migrated or worsened in severity over the past 3 weeks.  It has been persistent.  Volume of hematochezia has also been persistent.  Hematochezia is worsened anytime she eats anything.   This has caused her to eat less.  She denies any recent weight loss.  During her previous ED visit, there was a plan for admission for management of flare symptoms.  Patient declined this at that time, in order to be home with her 39-monthold daughter.  Patient's last analgesia at home was oxycodone last night.  She did take her morning dose of steroid.  She denies any other symptoms.  She has not had urinary or vaginal symptoms.    Past Medical History:  Diagnosis Date   [place]'s disease [place]    Frequent headaches    Migraine    [place]cerative colitis [place]     There are no problems to display for this patient.   History reviewed. No pertinent surgical history.   OB History   No obstetric history on file.     No family history on file.  Social History   Tobacco Use   Smoking status: Never   Smokeless tobacco: Never  Vaping Use   Vaping Use: Never used  Substance Use Topics   Alcohol use: No   Drug use: No    Home Medications Prior to Admission medications   Medication Sig Start Date End Date Taking? Authorizing Provider  ciprofloxacin (CIPRO) 500 MG tablet Take 1 tablet (500 mg total) by mouth 2 (two) times daily. 10/07/20   Mesner, JCorene Cornea MD  mesalamine (LIALDA) 1.2 g EC tablet Take 2 tablets (2.4 g  total) by mouth daily with breakfast. 10/31/20 12/30/20  Godfrey Pick, MD  nirmatrelvir/ritonavir EUA (PAXLOVID) TABS Patient GFR is >90. Take nirmatrelvir (150 mg) 2 tablet(s) twice daily for 5 days and ritonavir (100 mg) one tablet twice daily for 5 days. 08/08/20   Horton, Barbette Hair, MD  ondansetron (ZOFRAN ODT) 4 MG disintegrating tablet Take 1 tablet (4 mg total) by mouth every 8 (eight) hours as needed for up to 12 doses for nausea or vomiting. 10/31/20   Godfrey Pick, MD  oxyCODONE-acetaminophen (PERCOCET) 5-325 MG tablet Take 1-2 tablets by mouth every 6 (six) hours as needed. 10/25/20   Veryl Speak, MD  predniSONE (DELTASONE) 20 MG tablet 2 tabs po daily x 5 days, then 1 tabs x  10 days, then 1/2 tabs x 10 days 10/31/20   Godfrey Pick, MD    Allergies    Hydrocodone-acetaminophen and Morphine and related  Review of Systems   Review of Systems  Constitutional:  Negative for activity change, chills, fatigue and fever.  HENT:  Negative for ear pain and sore throat.   Eyes:  Negative for pain and visual disturbance.  Respiratory:  Negative for cough and shortness of breath.   Cardiovascular:  Negative for chest pain and palpitations.  Gastrointestinal:  Positive for abdominal pain, blood in stool, diarrhea and nausea. Negative for vomiting.  Genitourinary:  Negative for dysuria, flank pain, hematuria, pelvic pain, vaginal bleeding and vaginal discharge.  Musculoskeletal:  Negative for arthralgias, back pain and myalgias.  Skin:  Negative for color change and rash.  Neurological:  Negative for dizziness, seizures, syncope, light-headedness and headaches.  All other systems reviewed and are negative.  Physical Exam Updated Vital Signs BP 120/63   Pulse 80   Temp 97.6 F (36.4 C) (Oral)   Resp 18   SpO2 98%   Physical Exam Vitals and nursing note reviewed.  Constitutional:      General: She is not in acute distress.    Appearance: She is well-developed.  HENT:     Head: Normocephalic and atraumatic.  Eyes:     Conjunctiva/sclera: Conjunctivae normal.  Cardiovascular:     Rate and Rhythm: Regular rhythm. Tachycardia present.     Heart sounds: No murmur heard. Pulmonary:     Effort: Pulmonary effort is normal. No respiratory distress.     Breath sounds: Normal breath sounds.  Abdominal:     Palpations: Abdomen is soft.     Tenderness: There is abdominal tenderness in the right lower quadrant. There is no right CVA tenderness, left CVA tenderness or guarding.  Musculoskeletal:     Cervical back: Neck supple.  Skin:    General: Skin is warm and dry.  Neurological:     General: No focal deficit present.     Mental Status: She is alert and oriented to  person, place, and time.     Cranial Nerves: No cranial nerve deficit.     Motor: No weakness.  Psychiatric:        Mood and Affect: Mood normal.        Behavior: Behavior normal.    ED Results / Procedures / Treatments   Labs (all labs ordered are listed, but only abnormal results are displayed) Labs Reviewed  COMPREHENSIVE METABOLIC PANEL - Abnormal; Notable for the following components:      Result Value   Glucose, Bld 105 (*)    Calcium 8.5 (*)    Total Protein 6.1 (*)    Albumin 3.0 (*)  All other components within normal limits  CBC - Abnormal; Notable for the following components:   Hemoglobin 11.2 (*)    MCV 75.1 (*)    MCH 23.1 (*)    RDW 17.7 (*)    Platelets 594 (*)    All other components within normal limits  URINALYSIS, ROUTINE W REFLEX MICROSCOPIC - Abnormal; Notable for the following components:   Specific Gravity, Urine <1.005 (*)    Hgb urine dipstick SMALL (*)    All other components within normal limits  C-REACTIVE PROTEIN - Abnormal; Notable for the following components:   CRP 3.4 (*)    All other components within normal limits  URINALYSIS, MICROSCOPIC (REFLEX) - Abnormal; Notable for the following components:   Bacteria, UA FEW (*)    All other components within normal limits  LIPASE, BLOOD  PREGNANCY, URINE  SEDIMENTATION RATE    EKG None  Radiology No results found.  Procedures Procedures   Medications Ordered in ED Medications  lactated ringers bolus 1,000 mL ( Intravenous Stopped 10/31/20 1123)  methylPREDNISolone sodium succinate (SOLU-MEDROL) 125 mg/2 mL injection 125 mg (125 mg Intravenous Given 10/31/20 1018)  HYDROmorphone (DILAUDID) injection 0.5 mg (0.5 mg Intravenous Given 10/31/20 1024)  oxyCODONE-acetaminophen (PERCOCET/ROXICET) 5-325 MG per tablet 1 tablet (1 tablet Oral Given 10/31/20 1354)    ED Course  I have reviewed the triage vital signs and the nursing notes.  Pertinent labs & imaging results that were available  during my care of the patient were reviewed by me and considered in my medical decision making (see chart for details).    MDM Rules/Calculators/A&P                           Patient is 25 year old female with history of ulcerative colitis who presents for persistent lower abdominal pain and hematochezia over the past 3 weeks.  Since onset of her symptoms, this is her third emergency department visit.  She has been on steroid therapy and is currently taking 20 mg of prednisone per day.  Vital signs upon arrival notable for mild tachycardia.  Bolus of IV fluids were ordered given her decreased p.o. intake.  Morphine was ordered for analgesia.  Patient requested Dilaudid instead.  Dilaudid was given.  Labs were obtained, including inflammatory markers.  Solu-Medrol given for management of UC flare.  I spoke with the patient about possibly obtaining a CT scan.  Patient's last CT scan was 3 weeks ago.  Given that she has not had any change in symptoms since that time, shared decision discussion resulted in plan to defer CT scan today to limit radiation in this young patient.  Laboratory work-up showed baseline hemoglobin, no leukocytosis.  Platelets were mildly elevated.  ESR was normal.  CRP was mildly elevated at 3.4.  This is consistent with previous labs from 3 weeks ago.  On reassessment, patient's heart rate improved.  Patient reported improvement in symptoms as well.  I spoke with the on-call gastroenterologist for plan of care.  Given that she was followed by [facility] gastroenterology in the past, plan will be for her to continue to follow-up with them.  Patient was agreeable to this.  Patient states that she will continue to reach out to their office to obtain a closer follow-up.  GI also advised that patient can start on mesalamine in addition to ongoing steroid therapy.  These medications were prescribed.  Patient was agreeable to plan.  She was encouraged  to return to the ED for worsening severity  of her symptoms.  She was discharged in stable condition.  Final Clinical Impression(s) / ED Diagnoses Final diagnoses:  Lower abdominal pain    Rx / DC Orders ED Discharge Orders          Ordered    predniSONE (DELTASONE) 20 MG tablet  Status:  Discontinued        10/31/20 1538    mesalamine (LIALDA) 1.2 g EC tablet  2 times daily,   Status:  Discontinued        10/31/20 1538    ondansetron (ZOFRAN ODT) 4 MG disintegrating tablet  Every 8 hours PRN,   Status:  Discontinued        10/31/20 1538    mesalamine (LIALDA) 1.2 g EC tablet  Daily with breakfast        10/31/20 1542    ondansetron (ZOFRAN ODT) 4 MG disintegrating tablet  Every 8 hours PRN        10/31/20 1542    predniSONE (DELTASONE) 20 MG tablet        10/31/20 1542             Godfrey Pick, MD 11/01/20 [phone]

## 2020-10-31 NOTE — Discharge Instructions (Addendum)
Call your gastroenterologist office to get a close follow-up appointment.  Additionally, there is a telephone number below to call to establish a family doctor.  When you go to your follow-up appointments, discuss medications to treat your ongoing symptoms.  There are 3 prescriptions attached.  Continue to take prednisone, 40 mg/day for the next 5 days.  Take 20 mg/day for the following 10 days.  After that, take 10 mg/day for another 10 days.  There is an additional prescription for mesalamine.  Take as prescribed.  Continue this medication until you are able to see your GI doctor for follow-up.  The third prescription is for Zofran.  Take this only as needed for nausea.

## 2020-10-31 NOTE — ED Triage Notes (Signed)
Pt reports abdominal pain and melena. Pt reports Hx of Crohn's and ulcerative colotis.

## 2020-10-31 NOTE — ED Notes (Signed)
Patient left ED ambulatory with steady independent gait and is A/O.

## 2020-11-14 ENCOUNTER — Emergency Department (HOSPITAL_BASED_OUTPATIENT_CLINIC_OR_DEPARTMENT_OTHER): Payer: Medicaid Other

## 2020-11-14 ENCOUNTER — Other Ambulatory Visit: Payer: Self-pay

## 2020-11-14 ENCOUNTER — Inpatient Hospital Stay (HOSPITAL_COMMUNITY): Payer: Medicaid Other

## 2020-11-14 ENCOUNTER — Encounter (HOSPITAL_BASED_OUTPATIENT_CLINIC_OR_DEPARTMENT_OTHER): Payer: Self-pay | Admitting: Emergency Medicine

## 2020-11-14 ENCOUNTER — Inpatient Hospital Stay (HOSPITAL_BASED_OUTPATIENT_CLINIC_OR_DEPARTMENT_OTHER)
Admission: EM | Admit: 2020-11-14 | Discharge: 2020-11-17 | DRG: 315 | Disposition: A | Discharge status: Home / Self Care | Payer: Medicaid Other | Attending: Internal Medicine | Admitting: Internal Medicine

## 2020-11-14 DIAGNOSIS — D509 Iron deficiency anemia, unspecified: Secondary | ICD-10-CM | POA: Diagnosis not present

## 2020-11-14 DIAGNOSIS — K5100 Ulcerative (chronic) pancolitis without complications: Secondary | ICD-10-CM | POA: Diagnosis present

## 2020-11-14 DIAGNOSIS — G43909 Migraine, unspecified, not intractable, without status migrainosus: Secondary | ICD-10-CM | POA: Diagnosis present

## 2020-11-14 DIAGNOSIS — I959 Hypotension, unspecified: Secondary | ICD-10-CM | POA: Diagnosis not present

## 2020-11-14 DIAGNOSIS — K769 Liver disease, unspecified: Secondary | ICD-10-CM | POA: Diagnosis present

## 2020-11-14 DIAGNOSIS — R778 Other specified abnormalities of plasma proteins: Secondary | ICD-10-CM

## 2020-11-14 DIAGNOSIS — D539 Nutritional anemia, unspecified: Secondary | ICD-10-CM | POA: Diagnosis present

## 2020-11-14 DIAGNOSIS — Z8616 Personal history of COVID-19: Secondary | ICD-10-CM

## 2020-11-14 DIAGNOSIS — R1900 Intra-abdominal and pelvic swelling, mass and lump, unspecified site: Secondary | ICD-10-CM | POA: Diagnosis present

## 2020-11-14 DIAGNOSIS — R7989 Other specified abnormal findings of blood chemistry: Secondary | ICD-10-CM | POA: Diagnosis present

## 2020-11-14 DIAGNOSIS — D500 Iron deficiency anemia secondary to blood loss (chronic): Secondary | ICD-10-CM | POA: Diagnosis present

## 2020-11-14 DIAGNOSIS — K5090 Crohn's disease, unspecified, without complications: Secondary | ICD-10-CM | POA: Diagnosis present

## 2020-11-14 DIAGNOSIS — Z885 Allergy status to narcotic agent status: Secondary | ICD-10-CM

## 2020-11-14 DIAGNOSIS — R932 Abnormal findings on diagnostic imaging of liver and biliary tract: Secondary | ICD-10-CM

## 2020-11-14 DIAGNOSIS — E871 Hypo-osmolality and hyponatremia: Secondary | ICD-10-CM | POA: Diagnosis present

## 2020-11-14 DIAGNOSIS — D508 Other iron deficiency anemias: Secondary | ICD-10-CM | POA: Diagnosis not present

## 2020-11-14 DIAGNOSIS — R079 Chest pain, unspecified: Secondary | ICD-10-CM

## 2020-11-14 DIAGNOSIS — I514 Myocarditis, unspecified: Secondary | ICD-10-CM | POA: Diagnosis present

## 2020-11-14 DIAGNOSIS — R000 Tachycardia, unspecified: Secondary | ICD-10-CM | POA: Diagnosis present

## 2020-11-14 DIAGNOSIS — I319 Disease of pericardium, unspecified: Secondary | ICD-10-CM | POA: Diagnosis present

## 2020-11-14 DIAGNOSIS — Z791 Long term (current) use of non-steroidal anti-inflammatories (NSAID): Secondary | ICD-10-CM

## 2020-11-14 DIAGNOSIS — Z20822 Contact with and (suspected) exposure to covid-19: Secondary | ICD-10-CM | POA: Diagnosis present

## 2020-11-14 DIAGNOSIS — Z79899 Other long term (current) drug therapy: Secondary | ICD-10-CM | POA: Diagnosis not present

## 2020-11-14 DIAGNOSIS — K50919 Crohn's disease, unspecified, with unspecified complications: Secondary | ICD-10-CM | POA: Diagnosis not present

## 2020-11-14 DIAGNOSIS — A419 Sepsis, unspecified organism: Secondary | ICD-10-CM

## 2020-11-14 DIAGNOSIS — R0789 Other chest pain: Secondary | ICD-10-CM

## 2020-11-14 LAB — BASIC METABOLIC PANEL
Anion gap: 11 (ref 5–15)
BUN: 10 mg/dL (ref 6–20)
CO2: 23 mmol/L (ref 22–32)
Calcium: 8.8 mg/dL — ABNORMAL LOW (ref 8.9–10.3)
Chloride: 101 mmol/L (ref 98–111)
Creatinine, Ser: 0.87 mg/dL (ref 0.44–1.00)
GFR, Estimated: >60 mL/min (ref >60)
Glucose, Bld: 148 mg/dL — ABNORMAL HIGH (ref 70–99)
Potassium: 3.5 mmol/L (ref 3.5–5.1)
Sodium: 135 mmol/L (ref 135–145)

## 2020-11-14 LAB — CBC WITH DIFFERENTIAL/PLATELET
Abs Immature Granulocytes: 0.03 10*3/uL (ref 0.00–0.07)
Basophils Absolute: 0 10*3/uL (ref 0.0–0.1)
Basophils Relative: 1 %
Eosinophils Absolute: 0.1 10*3/uL (ref 0.0–0.5)
Eosinophils Relative: 1 %
HCT: 32.4 % — ABNORMAL LOW (ref 36.0–46.0)
Hemoglobin: 9.9 g/dL — ABNORMAL LOW (ref 12.0–15.0)
Immature Granulocytes: 0 %
Lymphocytes Relative: 27 %
Lymphs Abs: 2.1 10*3/uL (ref 0.7–4.0)
MCH: 23 pg — ABNORMAL LOW (ref 26.0–34.0)
MCHC: 30.6 g/dL (ref 30.0–36.0)
MCV: 75.3 fL — ABNORMAL LOW (ref 80.0–100.0)
Monocytes Absolute: 1 10*3/uL (ref 0.1–1.0)
Monocytes Relative: 12 %
Neutro Abs: 4.7 10*3/uL (ref 1.7–7.7)
Neutrophils Relative %: 59 %
Platelets: 633 10*3/uL — ABNORMAL HIGH (ref 150–400)
RBC: 4.3 MIL/uL (ref 3.87–5.11)
RDW: 17.4 % — ABNORMAL HIGH (ref 11.5–15.5)
WBC: 8 10*3/uL (ref 4.0–10.5)
nRBC: 0 % (ref 0.0–0.2)

## 2020-11-14 LAB — RAPID URINE DRUG SCREEN, HOSP PERFORMED
Amphetamines: NOT DETECTED
Barbiturates: NOT DETECTED
Benzodiazepines: NOT DETECTED
Cocaine: NOT DETECTED
Opiates: POSITIVE — AB
Tetrahydrocannabinol: NOT DETECTED

## 2020-11-14 LAB — TSH: TSH: 0.644 u[IU]/mL (ref 0.350–4.500)

## 2020-11-14 LAB — HEPATIC FUNCTION PANEL
ALT: 27 U/L (ref 0–44)
AST: 66 U/L — ABNORMAL HIGH (ref 15–41)
Albumin: 2.9 g/dL — ABNORMAL LOW (ref 3.5–5.0)
Alkaline Phosphatase: 66 U/L (ref 38–126)
Bilirubin, Direct: 0.8 mg/dL — ABNORMAL HIGH (ref 0.0–0.2)
Indirect Bilirubin: 1 mg/dL — ABNORMAL HIGH (ref 0.3–0.9)
Total Bilirubin: 1.8 mg/dL — ABNORMAL HIGH (ref 0.3–1.2)
Total Protein: 6.4 g/dL — ABNORMAL LOW (ref 6.5–8.1)

## 2020-11-14 LAB — TROPONIN I (HIGH SENSITIVITY)
Troponin I (High Sensitivity): 463 ng/L (ref <18)
Troponin I (High Sensitivity): 553 ng/L (ref <18)
Troponin I (High Sensitivity): 271 ng/L (ref <18)
Troponin I (High Sensitivity): 261 ng/L (ref <18)

## 2020-11-14 LAB — FERRITIN: Ferritin: 48 ng/mL (ref 11–307)

## 2020-11-14 LAB — URINALYSIS, ROUTINE W REFLEX MICROSCOPIC
Bilirubin Urine: NEGATIVE
Glucose, UA: NEGATIVE mg/dL
Ketones, ur: 15 mg/dL — AB
Leukocytes,Ua: NEGATIVE
Nitrite: NEGATIVE
Protein, ur: NEGATIVE mg/dL
Specific Gravity, Urine: 1.01 (ref 1.005–1.030)
pH: 6 (ref 5.0–8.0)

## 2020-11-14 LAB — RESP PANEL BY RT-PCR (FLU A&B, COVID) ARPGX2
Influenza A by PCR: NEGATIVE
Influenza B by PCR: NEGATIVE
SARS Coronavirus 2 by RT PCR: NEGATIVE

## 2020-11-14 LAB — IRON AND TIBC
Iron: 9 ug/dL — ABNORMAL LOW (ref 28–170)
Saturation Ratios: 3 % — ABNORMAL LOW (ref 10.4–31.8)
TIBC: 270 ug/dL (ref 250–450)
UIBC: 261 ug/dL

## 2020-11-14 LAB — ECHOCARDIOGRAM COMPLETE
AR max vel: 2.22 cm2
AV Area VTI: 2.31 cm2
AV Area mean vel: 2.26 cm2
AV Mean grad: 5 mmHg
AV Peak grad: 8.8 mmHg
Ao pk vel: 1.48 m/s
Height: 65 in
S' Lateral: 3.4 cm
Weight: 2448 oz

## 2020-11-14 LAB — URINALYSIS, MICROSCOPIC (REFLEX)

## 2020-11-14 LAB — D-DIMER, QUANTITATIVE: D-Dimer, Quant: 1.88 ug/mL-FEU — ABNORMAL HIGH (ref 0.00–0.50)

## 2020-11-14 LAB — C-REACTIVE PROTEIN: CRP: 21.3 mg/dL — ABNORMAL HIGH (ref <1.0)

## 2020-11-14 LAB — SEDIMENTATION RATE: Sed Rate: 32 mm/h — ABNORMAL HIGH (ref 0–22)

## 2020-11-14 LAB — HIV ANTIBODY (ROUTINE TESTING W REFLEX): HIV Screen 4th Generation wRfx: NONREACTIVE

## 2020-11-14 LAB — PREGNANCY, URINE: Preg Test, Ur: NEGATIVE

## 2020-11-14 IMAGING — CT CT ANGIO CHEST
2 of 8 series · 18 of 36 positions shown · IV contrast (Omnipaque)
Comparison: No priors.

CLINICAL DATA: 25-year-old female with sharp midline chest pain for
the past 2 days with radiation to the back and right wrist. Positive
D-dimer. Evaluate for pulmonary embolism.

EXAM:
CT ANGIOGRAPHY CHEST WITH CONTRAST
TECHNIQUE: Multidetector CT imaging of the chest was performed using the
standard protocol during bolus administration of intravenous
contrast. Multiplanar CT image reconstructions and MIPs were
obtained to evaluate the vascular anatomy.
CONTRAST:  75mL OMNIPAQUE IOHEXOL 350 MG/ML SOLN

[Series 6: pe thins · axial · 0.75mm/px · z∈[+1112,+1347]mm · 17 of 263 slices shown]
[im 14/263  lung]
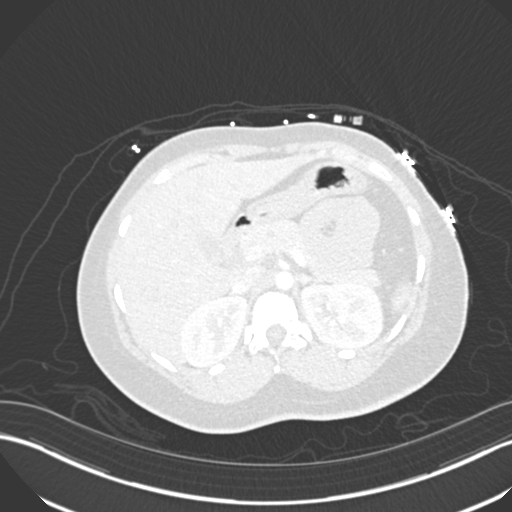
[im 28/263  mediastinal]
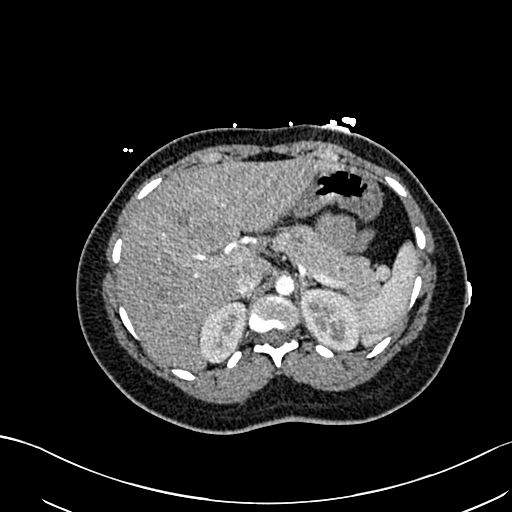
[im 42/263  lung]
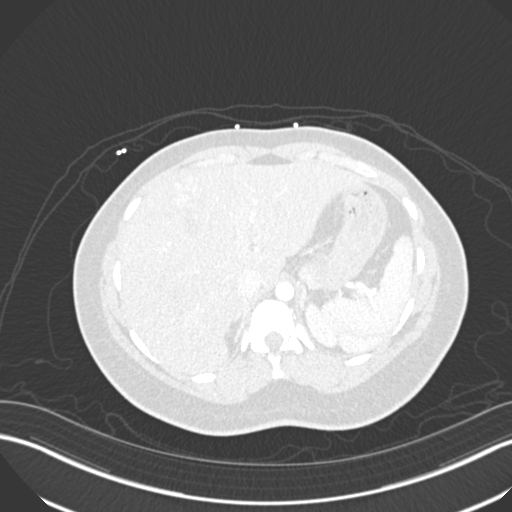
[im 56/263  mediastinal]
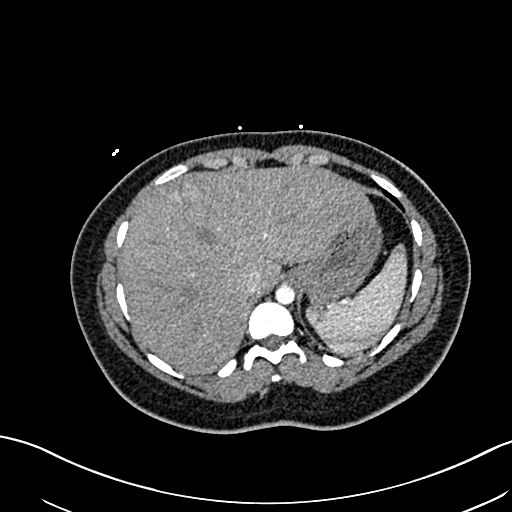
[im 69/263  lung]
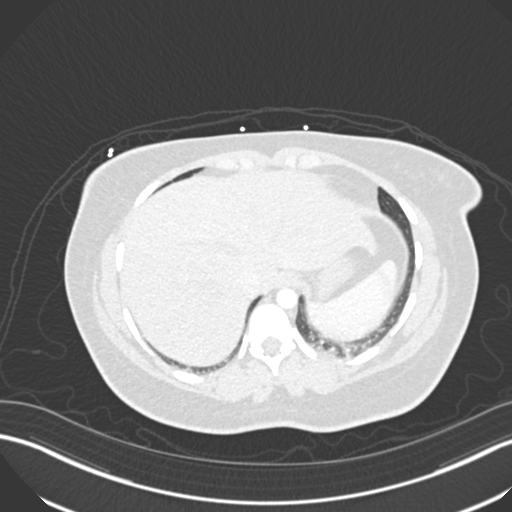
[im 83/263  mediastinal]
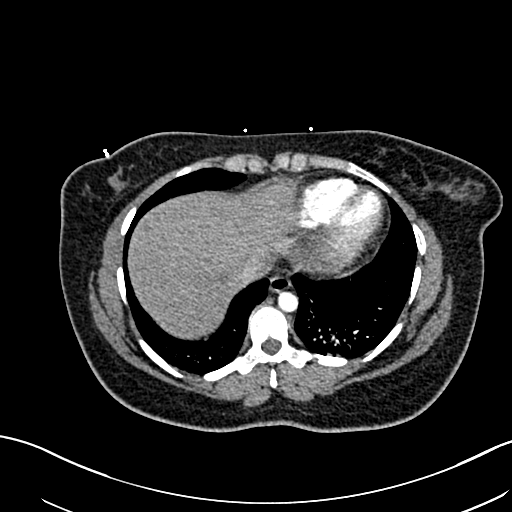
[im 97/263  lung]
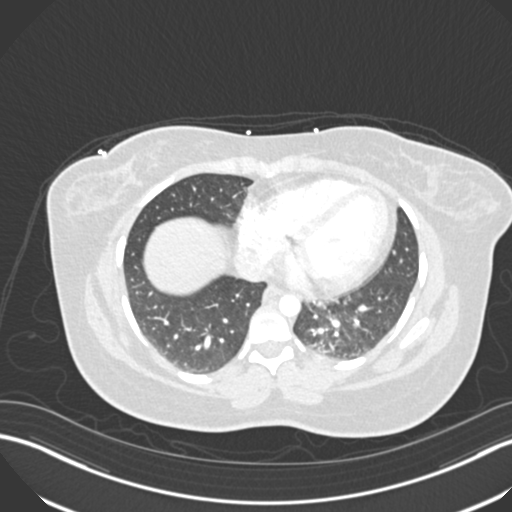
[im 111/263  mediastinal]
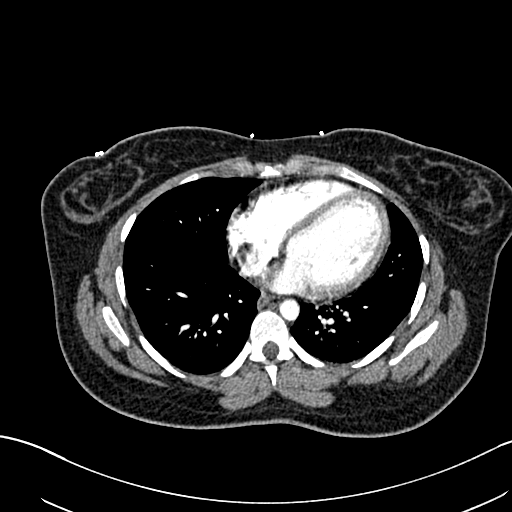
[im 138/263  lung]
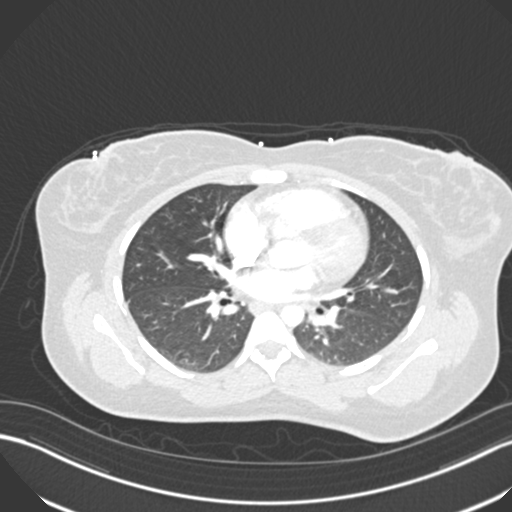
[im 152/263  mediastinal]
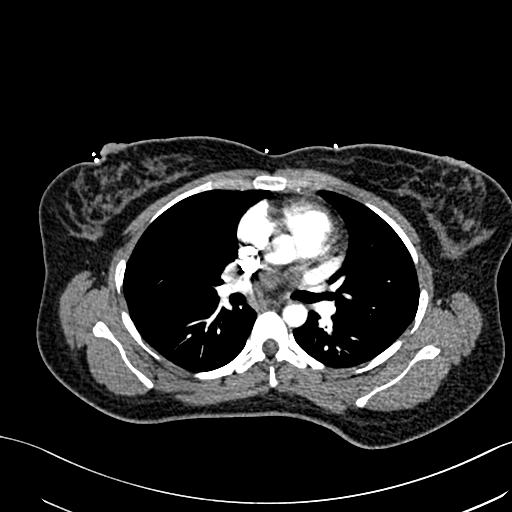
[im 166/263  lung]
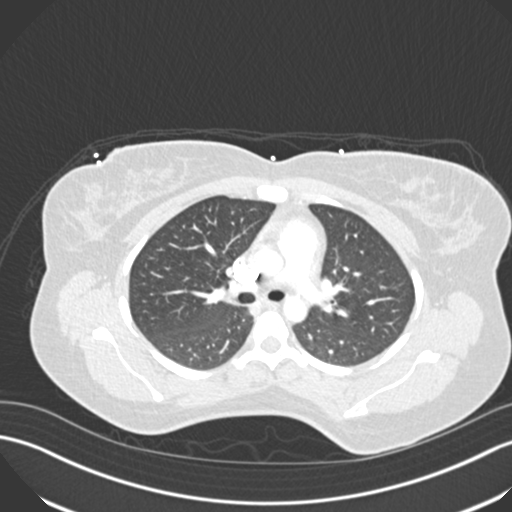
[im 180/263  mediastinal]
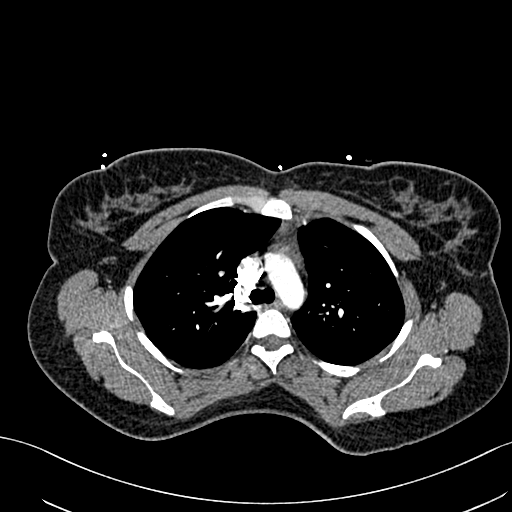
[im 194/263  lung]
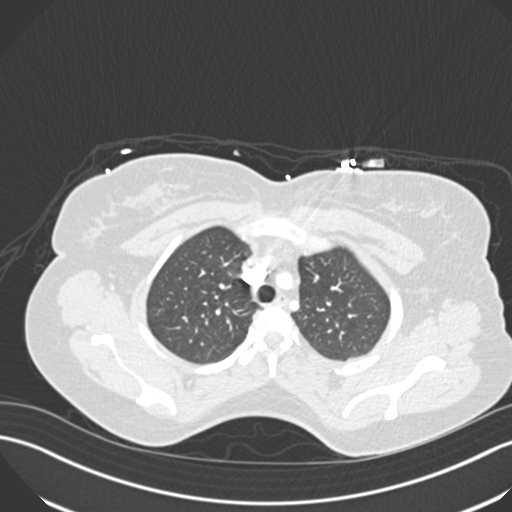
[im 207/263  mediastinal]
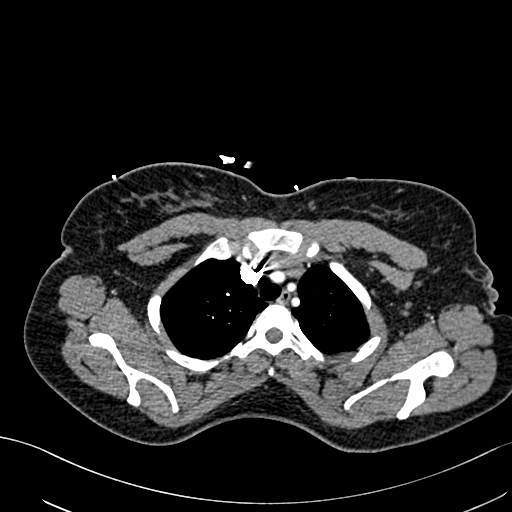
[im 221/263  lung]
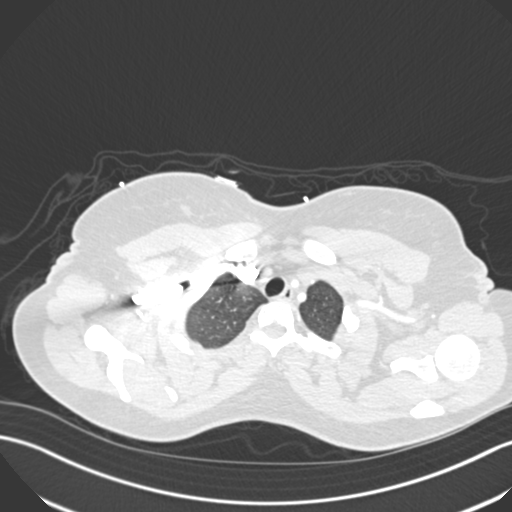
[im 235/263  mediastinal]
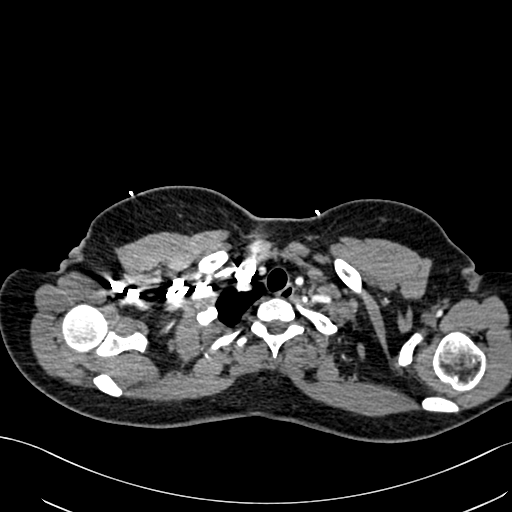
[im 249/263  lung]
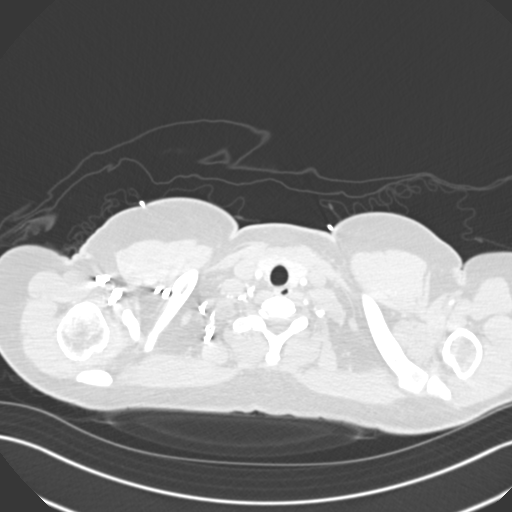

[Series 7: pe coronal mpr · coronal · 0.52mm/px · 1 of 106 slices shown]
[im 53/106  mediastinal]
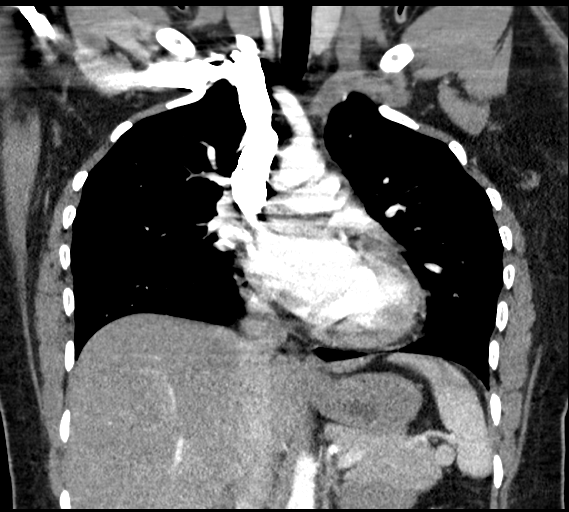

[18 of 36 positions shown; findings below may reference images not displayed]

FINDINGS: Cardiovascular: There are no filling defects within the pulmonary
arterial tree to suggest underlying pulmonary embolism. Heart size
is normal. There is no significant pericardial fluid, thickening or
pericardial calcification. No atherosclerotic calcifications are
noted in the thoracic aorta or the coronary arteries.

Mediastinum/Nodes: No pathologically enlarged mediastinal or hilar
lymph nodes. Esophagus is unremarkable in appearance. No axillary
lymphadenopathy.

Lungs/Pleura: Minimal subsegmental atelectasis noted in the left
lower lobe. No acute consolidative airspace disease. No pleural
effusions. No suspicious appearing pulmonary nodules or masses are
noted.

Upper Abdomen: In segment 4A of the liver (axial image 72 of series
4 and coronal image 84 of series 7) there is a 3.5 x 2.5 x 2.4 cm
heterogeneously enhancing lesion which is incompletely characterized
on today's arterial phase examination.

Musculoskeletal: There are no aggressive appearing lytic or blastic
lesions noted in the visualized portions of the skeleton.

Review of the MIP images confirms the above findings.
IMPRESSION: 1. No evidence of pulmonary embolism.
2. No acute findings to account for the patient's symptoms.
3. Large heterogeneously enhancing lesion in segment 4A of the
liver, incompletely characterized on today's arterial phase
examination. Further characterization with nonemergent abdominal MRI
with and without IV gadolinium (preferably Eovist) is recommended in
the near future.

## 2020-11-14 IMAGING — DX DG CHEST 2V
2 series · 2 of 2 positions shown · non-contrast
Comparison: Chest x-ray [DATE].

CLINICAL DATA: 25-year-old female with history of chest pain. Pain
radiating into the back and down into the right wrist. Pain worsens
with movement.

EXAM:
CHEST - 2 VIEW

[chest pa]
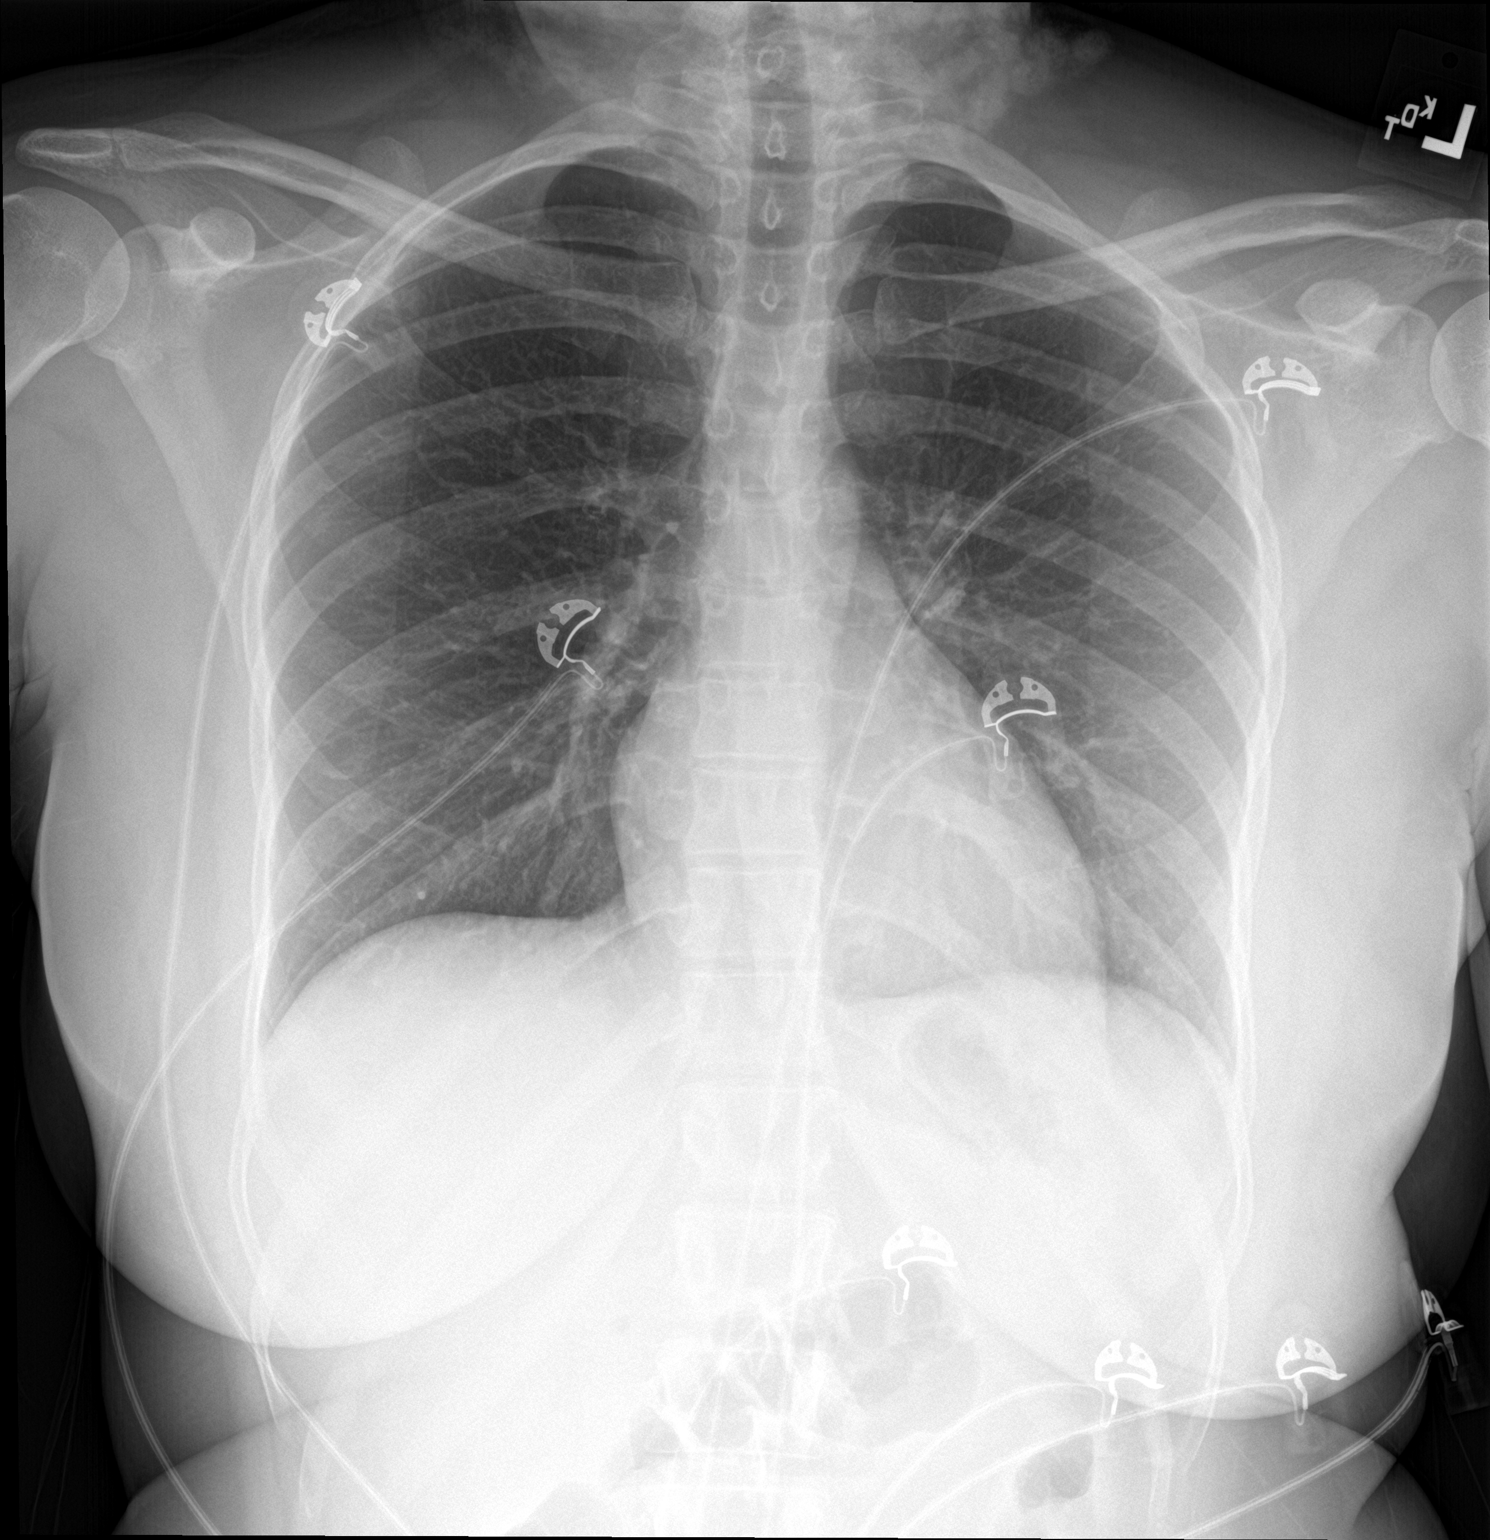

[chest lat]
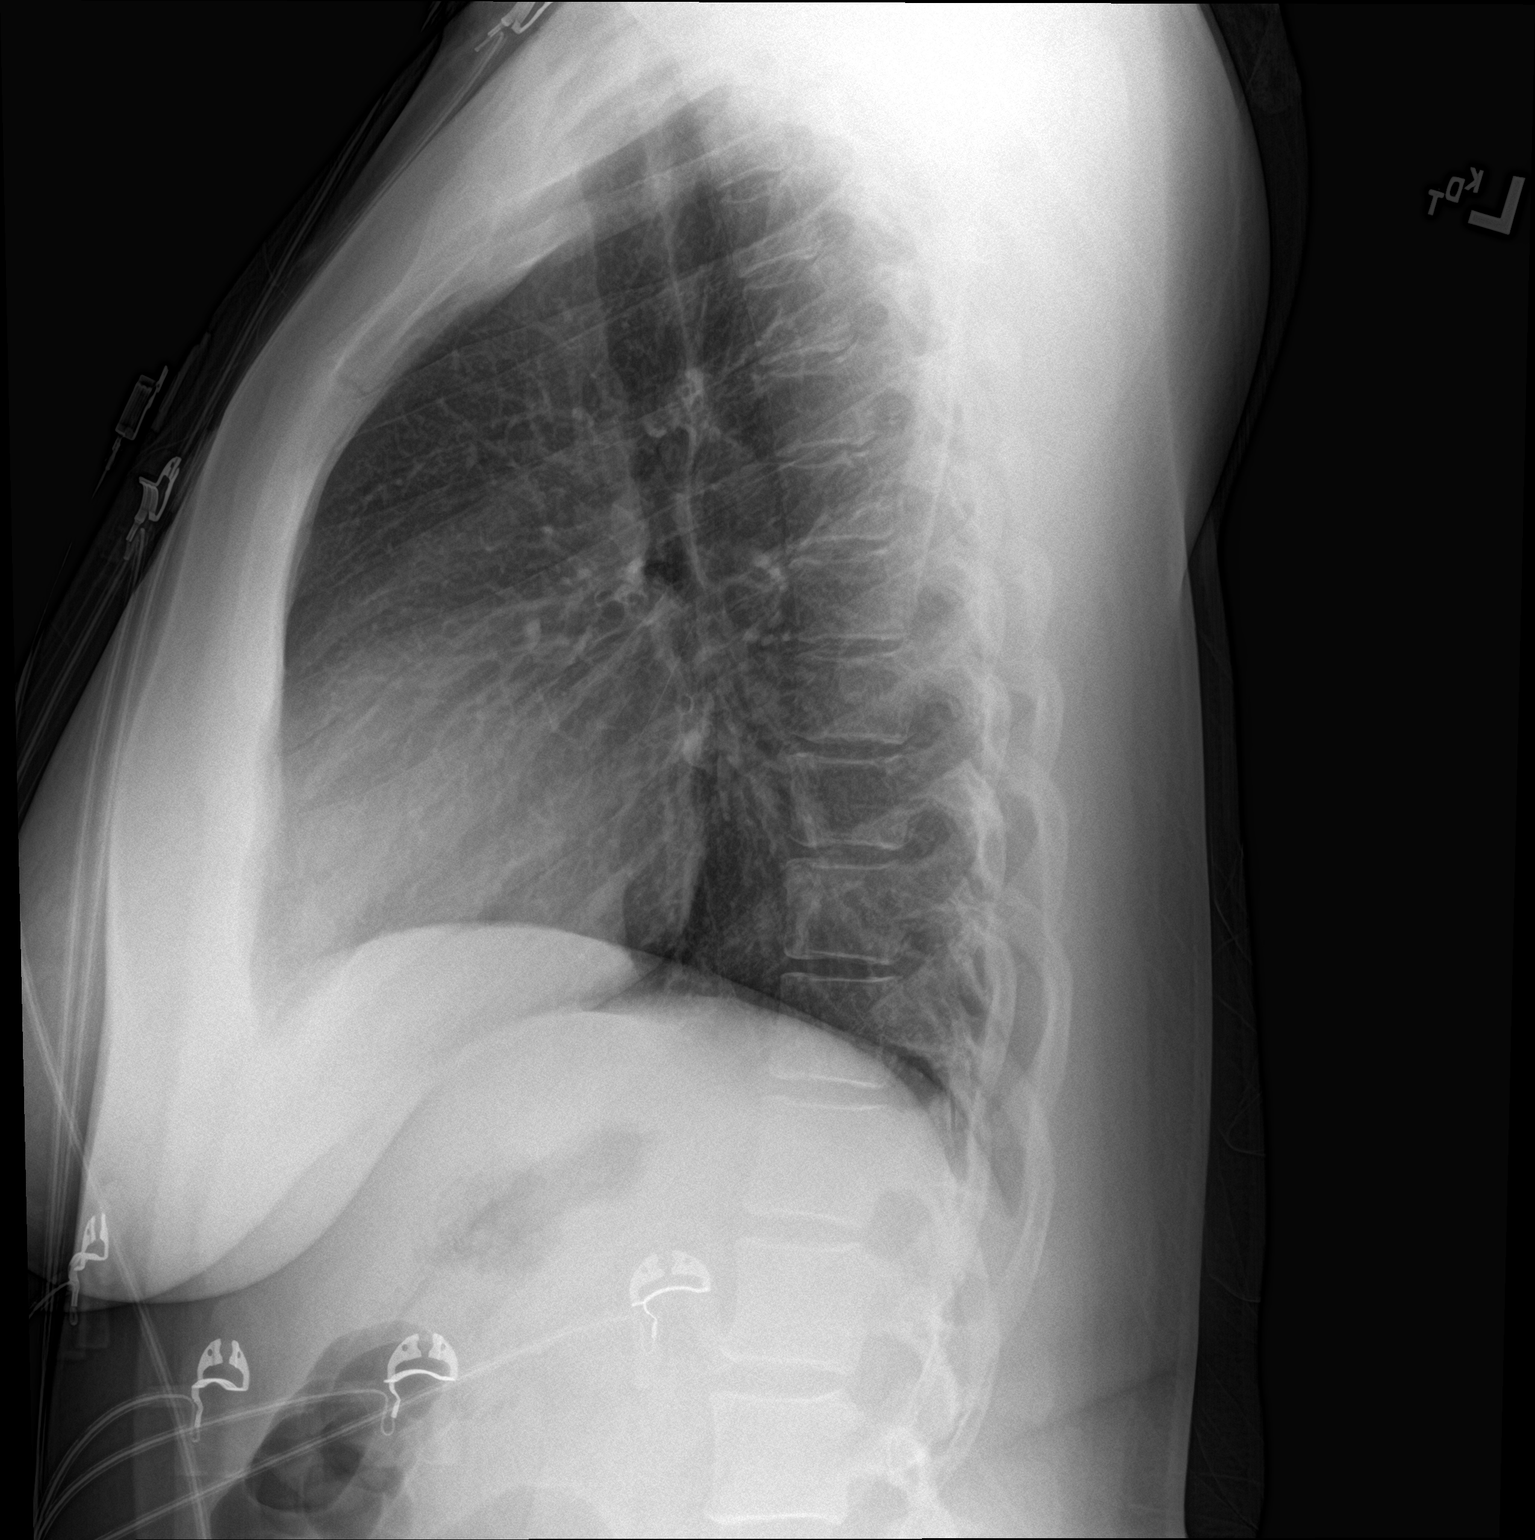

[2 of 2 positions shown; findings below may reference images not displayed]

FINDINGS: Lung volumes are normal. No consolidative airspace disease. No
pleural effusions. No pneumothorax. No pulmonary nodule or mass
noted. Pulmonary vasculature and the cardiomediastinal silhouette
are within normal limits.
IMPRESSION: No radiographic evidence of acute cardiopulmonary disease.

## 2020-11-14 MED ORDER — IBUPROFEN 600 MG PO TABS
600.0000 mg | ORAL_TABLET | Freq: Three times a day (TID) | ORAL | Status: DC
  Filled 2020-11-14: qty 3

## 2020-11-14 MED ORDER — HYDROMORPHONE HCL 1 MG/ML IJ SOLN
1.0000 mg | Freq: Once | INTRAMUSCULAR | Status: AC
  Administered 2020-11-14: 1 mg via INTRAVENOUS
  Filled 2020-11-14: qty 1

## 2020-11-14 MED ORDER — HYDROMORPHONE HCL 1 MG/ML IJ SOLN
0.5000 mg | INTRAMUSCULAR | Status: DC | PRN
Start: 2020-11-14 — End: 2020-11-17
  Administered 2020-11-14 – 2020-11-17 (×10): 0.5 mg via INTRAVENOUS
  Filled 2020-11-14 (×10): qty 0.5

## 2020-11-14 MED ORDER — ONDANSETRON HCL 4 MG/2ML IJ SOLN
4.0000 mg | Freq: Once | INTRAMUSCULAR | Status: AC
  Administered 2020-11-14: 4 mg via INTRAVENOUS
  Filled 2020-11-14: qty 2

## 2020-11-14 MED ORDER — FENTANYL CITRATE (PF) 100 MCG/2ML IJ SOLN
100.0000 ug | Freq: Once | INTRAMUSCULAR | Status: AC
Start: 2020-11-14 — End: 2020-11-14
  Administered 2020-11-14: 100 ug via INTRAVENOUS
  Filled 2020-11-14: qty 2

## 2020-11-14 MED ORDER — IOHEXOL 350 MG/ML SOLN
75.0000 mL | Freq: Once | INTRAVENOUS | Status: AC | PRN
  Administered 2020-11-14: 75 mL via INTRAVENOUS

## 2020-11-14 MED ORDER — ONDANSETRON 4 MG PO TBDP
4.0000 mg | ORAL_TABLET | Freq: Three times a day (TID) | ORAL | Status: DC | PRN
  Administered 2020-11-14 – 2020-11-15 (×3): 4 mg via ORAL
  Filled 2020-11-14 (×4): qty 1

## 2020-11-14 MED ORDER — KETOROLAC TROMETHAMINE 15 MG/ML IJ SOLN: 15.0000 mg | Freq: Four times a day (QID) | INTRAMUSCULAR | Status: DC | PRN

## 2020-11-14 MED ORDER — KETOROLAC TROMETHAMINE 15 MG/ML IJ SOLN
15.0000 mg | Freq: Once | INTRAMUSCULAR | Status: AC
  Administered 2020-11-14: 15 mg via INTRAVENOUS
  Filled 2020-11-14: qty 1

## 2020-11-14 MED ORDER — SODIUM CHLORIDE 0.9 % IV BOLUS
1000.0000 mL | Freq: Once | INTRAVENOUS | Status: AC
  Administered 2020-11-14: 1000 mL via INTRAVENOUS

## 2020-11-14 MED ORDER — FENTANYL CITRATE (PF) 100 MCG/2ML IJ SOLN
50.0000 ug | Freq: Once | INTRAMUSCULAR | Status: AC
  Administered 2020-11-14: 50 ug via INTRAVENOUS
  Filled 2020-11-14: qty 2

## 2020-11-14 MED ORDER — COLCHICINE 0.6 MG PO TABS
0.6000 mg | ORAL_TABLET | Freq: Two times a day (BID) | ORAL | Status: DC
  Administered 2020-11-14 – 2020-11-17 (×6): 0.6 mg via ORAL
  Filled 2020-11-14 (×6): qty 1

## 2020-11-14 MED ORDER — COLCHICINE 0.6 MG PO TABS: 0.6000 mg | ORAL_TABLET | Freq: Two times a day (BID) | ORAL | Status: DC

## 2020-11-14 MED ORDER — COLCHICINE 0.6 MG PO TABS: 0.6000 mg | ORAL_TABLET | Freq: Every day | ORAL | Status: DC

## 2020-11-14 NOTE — Progress Notes (Signed)
  Echocardiogram 2D Echocardiogram has been performed.  Robin Fuller F 11/14/2020, 3:52 PM

## 2020-11-14 NOTE — ED Notes (Signed)
Pt and family given room number and  Phone number to room

## 2020-11-14 NOTE — H&P (Signed)
History and Physical    Debanhi Blaker DJS:[id number] DOB: 06/29/1995 DOA: 11/14/2020  PCP: Dr. Hubbard Robinson Consultants:  GI: Dr. Truman Hayward Patient coming from: [facility]  Chief Complaint: chest pain   HPI: Robin Fuller is a 25 y.o. 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  Chest pain started Sunday night in her right shoulder that was shooting down into her hand and into her lower back.  She went to bed and then woke up with her chest hurting. Pain was anterior aspect of her chest, not substernal. Pain rated as a 10/10 and is sharp in nature. No radiation. She has reproducible pain over her chest. Pain is worse with movement. She states the pain is unbearable last night and came to Er at 4AM.  Pain not made better by leaning forward and she denies pain with deep inspiration. She has associated sweating, no shortness of breath or coughing, no light headed/dizziness. Occasional headache, but none today. She has no abdominal pain, N/V/D. She has no swelling in her legs. No dysuria. She on depot injection for birth control, spotting now, but does not her cycle. No other pain. No recent illness. Had covid in May of 2022.   No family history of heart disease. She does not smoke, drink or do any illicit drugs. Recent baby in 04/2020, not breast feeding.   She states the toradol significantly helped the pain while in ER.   ED Course: vitals: temperature: 99.7, bp: 100/62, HR: 130, RR: 20, oxygen: 100% on room air. Pertinent labs: hgb: 9.9, platelets: 633, troponin: 463-->553, d-dimer: 1.88, ESR: 32, CRP: 21.3, AST: 66. CXR wnl, CTA negative for PE, large enhancing lesion in liver. Recommended MRI abdomen. Cardiology consulted. We were asked to admit.   Review of Systems: As per HPI; otherwise review of systems reviewed and negative.   Ambulatory Status:  Ambulates without assistance   Past Medical  History:  Diagnosis Date   Crohn's disease ([place])    Frequent headaches    Migraine    Ulcerative colitis ([place])     History reviewed. No pertinent surgical history.  Social History   Socioeconomic History   Marital status: Single    Spouse name: Not on file   Number of children: Not on file   Years of education: Not on file   Highest education level: Not on file  Occupational History   Not on file  Tobacco Use   Smoking status: Never   Smokeless tobacco: Never  Vaping Use   Vaping Use: Never used  Substance and Sexual Activity   Alcohol use: No   Drug use: No   Sexual activity: Yes    Birth control/protection: Injection  Other Topics Concern   Not on file  Social History Narrative   ** Merged History Encounter **       Social Determinants of Health   Financial Resource Strain: Not on file  Food Insecurity: Not on file  Transportation Needs: Not on file  Physical Activity: Not on file  Stress: Not on file  Social Connections: Not on file  Intimate Partner Violence: Not on file    Allergies  Allergen Reactions   Hydrocodone-Acetaminophen Nausea And Vomiting   Morphine And Related Itching    History reviewed. No pertinent family history.  Prior to Admission medications   Medication Sig Start Date End Date Taking? Authorizing Provider  ciprofloxacin (CIPRO) 500  MG tablet Take 1 tablet (500 mg total) by mouth 2 (two) times daily. 10/07/20   Mesner, Corene Cornea, MD  mesalamine (LIALDA) 1.2 g EC tablet Take 2 tablets (2.4 g total) by mouth daily with breakfast. 10/31/20 12/30/20  Godfrey Pick, MD  nirmatrelvir/ritonavir EUA (PAXLOVID) TABS Patient GFR is >90. Take nirmatrelvir (150 mg) 2 tablet(s) twice daily for 5 days and ritonavir (100 mg) one tablet twice daily for 5 days. 08/08/20   Horton, Barbette Hair, MD  ondansetron (ZOFRAN ODT) 4 MG disintegrating tablet Take 1 tablet (4 mg total) by mouth every 8 (eight) hours as needed for up to 12 doses for nausea or vomiting.  10/31/20   Godfrey Pick, MD  oxyCODONE-acetaminophen (PERCOCET) 5-325 MG tablet Take 1-2 tablets by mouth every 6 (six) hours as needed. 10/25/20   Veryl Speak, MD  predniSONE (DELTASONE) 20 MG tablet 2 tabs po daily x 5 days, then 1 tabs x 10 days, then 1/2 tabs x 10 days 10/31/20   Godfrey Pick, MD    Physical Exam: Vitals:   11/14/20 1200 11/14/20 1307 11/14/20 1330 11/14/20 1437  BP: (!) 90/59 (!) 91/56 99/60 100/69  Pulse: (!) 105 100 (!) 111 (!) 123  Resp: (!) 33 (!) 22 (!) 21 16  Temp:      TempSrc:      SpO2: 100% 97% 99% 100%  Weight:      Height:         General:  Appears calm and comfortable and is in NAD Eyes:  PERRL, EOMI, normal lids, iris ENT:  grossly normal hearing, lips & tongue, mmm; appropriate dentition Neck:  no LAD, masses or thyromegaly; no carotid bruits Cardiovascular:  RRR, no m/r/g. No LE edema. Significantly TTP across anterior chest wall  Respiratory:   CTA bilaterally with no wheezes/rales/rhonchi.  Normal respiratory effort. Abdomen:  soft, NT, ND, NABS Back:   normal alignment, no CVAT Skin:  no rash or induration seen on limited exam Musculoskeletal:  grossly normal tone BUE/BLE, good ROM, no bony abnormality Lower extremity:  No LE edema.  Limited foot exam with no ulcerations.  2+ distal pulses. Psychiatric:  grossly normal mood and affect, speech fluent and appropriate, AOx3 Neurologic:  CN 2-12 grossly intact, moves all extremities in coordinated fashion, sensation intact    Radiological Exams on Admission: Independently reviewed - see discussion in A/P where applicable  DG Chest 2 View  Result Date: 11/14/2020 CLINICAL DATA:  25 year old female with history of chest pain. Pain radiating into the back and down into the right wrist. Pain worsens with movement. EXAM: CHEST - 2 VIEW COMPARISON:  Chest x-ray 03/06/2016. FINDINGS: Lung volumes are normal. No consolidative airspace disease. No pleural effusions. No pneumothorax. No pulmonary nodule or  mass noted. Pulmonary vasculature and the cardiomediastinal silhouette are within normal limits. IMPRESSION: No radiographic evidence of acute cardiopulmonary disease. Electronically Signed   By: Vinnie Langton M.D.   On: 11/14/2020 05:51   CT Angio Chest PE W and/or Wo Contrast  Result Date: 11/14/2020 CLINICAL DATA:  25 year old female with sharp midline chest pain for the past 2 days with radiation to the back and right wrist. Positive D-dimer. Evaluate for pulmonary embolism. EXAM: CT ANGIOGRAPHY CHEST WITH CONTRAST TECHNIQUE: Multidetector CT imaging of the chest was performed using the standard protocol during bolus administration of intravenous contrast. Multiplanar CT image reconstructions and MIPs were obtained to evaluate the vascular anatomy. CONTRAST:  57m OMNIPAQUE IOHEXOL 350 MG/ML SOLN COMPARISON:  No priors. FINDINGS: Cardiovascular:  There are no filling defects within the pulmonary arterial tree to suggest underlying pulmonary embolism. Heart size is normal. There is no significant pericardial fluid, thickening or pericardial calcification. No atherosclerotic calcifications are noted in the thoracic aorta or the coronary arteries. Mediastinum/Nodes: No pathologically enlarged mediastinal or hilar lymph nodes. Esophagus is unremarkable in appearance. No axillary lymphadenopathy. Lungs/Pleura: Minimal subsegmental atelectasis noted in the left lower lobe. No acute consolidative airspace disease. No pleural effusions. No suspicious appearing pulmonary nodules or masses are noted. Upper Abdomen: In segment 4A of the liver (axial image 72 of series 4 and coronal image 84 of series 7) there is a 3.5 x 2.5 x 2.4 cm heterogeneously enhancing lesion which is incompletely characterized on today's arterial phase examination. Musculoskeletal: There are no aggressive appearing lytic or blastic lesions noted in the visualized portions of the skeleton. Review of the MIP images confirms the above findings.  IMPRESSION: 1. No evidence of pulmonary embolism. 2. No acute findings to account for the patient's symptoms. 3. Large heterogeneously enhancing lesion in segment 4A of the liver, incompletely characterized on today's arterial phase examination. Further characterization with nonemergent abdominal MRI with and without IV gadolinium (preferably Eovist) is recommended in the near future. Electronically Signed   By: Vinnie Langton M.D.   On: 11/14/2020 06:43    EKG: Independently reviewed.  Sinus tachycardia with rate 118; nonspecific ST changes with no evidence of acute ischemia   Labs on Admission: I have personally reviewed the available labs and imaging studies at the time of the admission.  Pertinent labs:  hgb: 9.9,  platelets: 633,  troponin: 463-->553,  d-dimer: 1.88,  ESR: 32,  CRP: 21.3,  AST: 66.  Assessment/Plan Principal Problem:   Chest pain of uncertain etiology 25 year female with 2 day history of anterior chest wall pain that is reproducible and elevated troponins -stat echo has been ordered and cardiology made aware patient is on the floor  -telemetry  -not typical hx or age for ACS -pericarditis/myocarditis on differential. Inflammatory markers mildly elevated in setting of chron's disease, so unsure baseline.  -checking ANA to r/o lupus/sarcoid/scleroderma.  -CXR wnl and CTA with no PE -started on colchicine, ibuprofen by cardiology -dilaudid for severe breakthrough pain   Active Problems:   Elevated troponin -trending upward -on tele -cardiology to see -stat echo pending     Liver lesion -Large heterogeneously enhancing lesion in segment 4A of the liver, incompletely characterized on today's arterial phase examination. Further characterization with nonemergent abdominal MRI with and without IV gadolinium (preferably Eovist) is recommended in the near future. -no pain on exam. AST at 66 -MRI per day team     Crohn's disease [facility]) -she tells me she has  either chron's/UC or both, they were unsure at time of cscope -has appointment with GI in a few months -weaned off her steroids with no current exacerbation. Would follow up on steroid taper, as it appears she should still be on this.  -continue lialda    Iron deficiency anemia -supposed to be on iron, but is not taking this.  -baseline hgb from this past year is around 9.0-9.7 -can not find iron studies, so have added these on.     D-dimer, elevated  -CTA negative for PE     Body mass index is 25.46 kg/m.    Level of care: Telemetry Cardiac DVT prophylaxis:  SCDs in case of any intervention  Code Status:  Full - confirmed with patient Family Communication: None present Disposition  Plan:  The patient is from: home  Anticipated d/c is to: home   Requires inpatient hospitalization and is at significant risk of worsening, requires constant monitoring, assessment and MDM with specialists.  Patient is currently: acutely ill Consults called: cardiology  Admission status:  inpatient    Orma Flaming MD [facility]   How to contact the [facility] Attending or Consulting provider [place] or covering provider during after hours [place], for this patient?  Check the care team in [facility] and look for a) attending/consulting [facility] provider listed and b) the [facility] team listed Log into [email or web address] and use [place]'s universal password to access. If you do not have the password, please contact the hospital operator. Locate the [facility] provider you are looking for under [facility] and page to a number that you can be directly reached. If you still have difficulty reaching the provider, please page the [facility] (Director on Call) for the Hospitalists listed on amion for assistance.   11/14/2020, 3:34 PM

## 2020-11-14 NOTE — ED Provider Notes (Signed)
Patient handed off to me at 7 AM.  Elevated troponin in the 450s.  CT scan shows no blood clot, no big pericardial effusion, no pneumonia.  COVID positive several months ago.  Tachycardic upon arrival in the 130s now in the low 100s.  No significant anemia, electrolyte [place], kidney injury.  Repeat troponin is 553.  Cardiology consulted and recommend adding the ESR and CRP and having her admitted for echocardiogram and further work-up for possible myocarditis.  Will admit to medicine for further care.  EKG shows sinus tachycardia.  No ischemic changes.  This chart was dictated using voice recognition software.  Despite best efforts to proofread,  errors can occur which can change the documentation meaning.    , , DO 11/14/20 0830  

## 2020-11-14 NOTE — ED Notes (Signed)
Dr C into speak to pt and her sister  about  Dx and tx and transport to other hospital

## 2020-11-14 NOTE — ED Notes (Signed)
Pt aware that she is going to be admitted,

## 2020-11-14 NOTE — ED Notes (Signed)
The patient was informed a urine sample is needed but she states she does not have to urinate at this time. Pt instructed to press call bell when she has to urinate.

## 2020-11-14 NOTE — Progress Notes (Signed)
Cardiology Consultation Note:   Patient ID: Robin Fuller MRN: [id number]; DOB: Oct 20, 1995   Admission date: 11/14/2020  PCP:  Quentin Angst, MD Consulting: [place] Providers Cardiologist:  None      New to [facility] MD Chief Complaint:  Chest pain  Patient Profile:   Robin Fuller is a 25 y.o. female with history of Crohn's Disease and ulcerative colitis who is being seen 11/14/2020 for the evaluation of myopericarditis.  History of Present Illness:   Robin Fuller notes that she is feeling chest pain ever since she weaned off of her steroids.  Patient notes that she was having nausea and vomiting with her steroids and was working to taper this medication  Was steroid free 11/12/20.  Patient notes that she began having right had pain with this.  This progressed to whole right arm pan 8/22.  Early this AM this had worsened to chest pain.  Chest pain is tolerable as long as she does not move forward or backward.  No change with activity. Chest pain was bad enough that it woke her up.  No fevers, chills, night sweats, sick contacts.      No shortness of breath, DOE .  No PND or orthopnea.  No weight gain, leg swelling , or abdominal swelling.  No syncope or near syncope . Notes  no palpitations or funny heart beats.   No leg claudication.  Patient went to ED for evaluation.  Novel troponin 400+-> 500+.  Negative PE (with incidental abdominal finding). Transferred to [facility].  Echo performed. ESR elevated.  Patient given heating pad for CP which has improved things slightly.   Past Medical History:  Diagnosis Date   Crohn's disease ([place])    Frequent headaches    Migraine    Ulcerative colitis ([place])     History reviewed. No pertinent surgical history.   Medications Prior to Admission: Prior to Admission medications   Medication Sig Start Date End Date Taking? Authorizing Provider  mesalamine (LIALDA) 1.2 g EC tablet Take 2 tablets  (2.4 g total) by mouth daily with breakfast. 10/31/20 12/30/20 Yes Godfrey Pick, MD  ondansetron (ZOFRAN ODT) 4 MG disintegrating tablet Take 1 tablet (4 mg total) by mouth every 8 (eight) hours as needed for up to 12 doses for nausea or vomiting. 10/31/20  Yes Godfrey Pick, MD  oxyCODONE-acetaminophen (PERCOCET) 5-325 MG tablet Take 1-2 tablets by mouth every 6 (six) hours as needed. 10/25/20  Yes Delo, Nathaneil Canary, MD  predniSONE (DELTASONE) 20 MG tablet 2 tabs po daily x 5 days, then 1 tabs x 10 days, then 1/2 tabs x 10 days Patient taking differently: Take 20 mg by mouth See admin instructions. 2 tabs po daily x 5 days, then 1 tabs x 10 days, then 1/2 tabs x 10 days 10/31/20  Yes Godfrey Pick, MD  ciprofloxacin (CIPRO) 500 MG tablet Take 1 tablet (500 mg total) by mouth 2 (two) times daily. Patient not taking: Reported on 11/14/2020 10/07/20   Mesner, Corene Cornea, MD  nirmatrelvir/ritonavir EUA (PAXLOVID) TABS Patient GFR is >90. Take nirmatrelvir (150 mg) 2 tablet(s) twice daily for 5 days and ritonavir (100 mg) one tablet twice daily for 5 days. Patient not taking: No sig reported 08/08/20   Horton, Barbette Hair, MD     Allergies:    Allergies  Allergen Reactions   Hydrocodone-Acetaminophen Nausea And Vomiting   Morphine And Related Itching    Social History:   Social History   Socioeconomic History  Marital status: Single    Spouse name: Not on file   Number of children: Not on file   Years of education: Not on file   Highest education level: Not on file  Occupational History   Not on file  Tobacco Use   Smoking status: Never   Smokeless tobacco: Never  Vaping Use   Vaping Use: Never used  Substance and Sexual Activity   Alcohol use: No   Drug use: No   Sexual activity: Yes    Birth control/protection: Injection  Other Topics Concern   Not on file  Social History Narrative   ** Merged History Encounter **       Social Determinants of Health   Financial Resource Strain: Not on file  Food  Insecurity: Not on file  Transportation Needs: Not on file  Physical Activity: Not on file  Stress: Not on file  Social Connections: Not on file  Intimate Partner Violence: Not on file    Family History:   History of coronary artery disease notable for no members. History of heart failure notable for no members. History of arrhythmia notable for no members. Denies family history of sudden cardiac death including drowning, car accidents, or unexplained deaths in the family.   ROS:  Please see the history of present illness.  All other ROS reviewed and negative.     Physical Exam/Data:   Vitals:   11/14/20 1200 11/14/20 1307 11/14/20 1330 11/14/20 1437  BP: (!) 90/59 (!) 91/56 99/60 100/69  Pulse: (!) 105 100 (!) 111 (!) 123  Resp: (!) 33 (!) 22 (!) 21 16  Temp:      TempSrc:      SpO2: 100% 97% 99% 100%  Weight:      Height:        Intake/Output Summary (Last 24 hours) at 11/14/2020 1755 Last data filed at 11/14/2020 0706 Gross per 24 hour  Intake 1000 ml  Output --  Net 1000 ml   Last 3 Weights 11/14/2020 10/25/2020 10/07/2020  Weight (lbs) 153 lb 160 lb 150 lb  Weight (kg) 69.4 kg 72.576 kg 68.04 kg     Body mass index is 25.46 kg/m.  General:  Well nourished, well developed, in no mild distress HEENT: normal, no scleral icterus Lymph: no adenopathy Neck: no JVD Endocrine:  No thryomegaly Vascular: No carotid bruits; FA pulses 2+ bilaterally without bruits  Cardiac:  normal S1, S2; RRR; no murmur no friction rub Lungs:  clear to auscultation bilaterally, no wheezing, rhonchi or rales  Abd: soft, nontender, no hepatomegaly  Ext: no edema Musculoskeletal:  No deformities, BUE and BLE strength normal and equal Skin: warm and dry  Neuro:  CNs 2-12 intact, no focal abnormalities noted Psych:  Normal affect   EKG:  The ECG that was done  was personally reviewed and demonstrates Sinus tachycardia, PR depression in V2,PR elevation in aVR, no ST changes  Relevant CV  Studies:  Transthoracic Echocardiogram: Date: 11/14/20 Results:  1. Left ventricular ejection fraction, by estimation, is 60 to 65%. Left  ventricular ejection fraction by 3D volume is 57 %. The left ventricle has  normal function. The left ventricle has no regional wall motion  abnormalities. Left ventricular diastolic   parameters were normal.   2. Right ventricular systolic function is normal. The right ventricular  size is normal.   3. The mitral valve is normal in structure. No evidence of mitral valve  regurgitation. No evidence of mitral stenosis.   4.  The aortic valve is normal in structure. Aortic valve regurgitation is  not visualized. No aortic stenosis is present.   5. The inferior vena cava is normal in size with greater than 50%  respiratory variability, suggesting right atrial pressure of 3 mmHg.  CTPE: Date: 11/14/20 Results: A. No CAC, Aortic and mPA size WNL B. No notable effusion 1. No evidence of pulmonary embolism. 2. No acute findings to account for the patient's symptoms. 3. Large heterogeneously enhancing lesion in segment 4A of the liver, incompletely characterized on today's arterial phase examination. Further characterization with nonemergent abdominal MRI with and without IV gadolinium (preferably Eovist) is recommended in the near future.   Laboratory Data:  High Sensitivity Troponin:   Recent Labs  Lab 11/14/20 0520 11/14/20 0654  TROPONINIHS 463* 553*      Chemistry Recent Labs  Lab 11/14/20 0520  NA 135  K 3.5  CL 101  CO2 23  GLUCOSE 148*  BUN 10  CREATININE 0.87  CALCIUM 8.8*  GFRNONAA >60  ANIONGAP 11    Recent Labs  Lab 11/14/20 0922  PROT 6.4*  ALBUMIN 2.9*  AST 66*  ALT 27  ALKPHOS 66  BILITOT 1.8*   Hematology Recent Labs  Lab 11/14/20 0520  WBC 8.0  RBC 4.30  HGB 9.9*  HCT 32.4*  MCV 75.3*  MCH 23.0*  MCHC 30.6  RDW 17.4*  PLT 633*   BNPNo results for input(s): BNP, PROBNP in the last 168 hours.   DDimer  Recent Labs  Lab 11/14/20 0520  DDIMER 1.88*     Radiology/Studies:  DG Chest 2 View  Result Date: 11/14/2020 CLINICAL DATA:  25 year old female with history of chest pain. Pain radiating into the back and down into the right wrist. Pain worsens with movement. EXAM: CHEST - 2 VIEW COMPARISON:  Chest x-ray 03/06/2016. FINDINGS: Lung volumes are normal. No consolidative airspace disease. No pleural effusions. No pneumothorax. No pulmonary nodule or mass noted. Pulmonary vasculature and the cardiomediastinal silhouette are within normal limits. IMPRESSION: No radiographic evidence of acute cardiopulmonary disease. Electronically Signed   By: Vinnie Langton M.D.   On: 11/14/2020 05:51   CT Angio Chest PE W and/or Wo Contrast  Result Date: 11/14/2020 CLINICAL DATA:  25 year old female with sharp midline chest pain for the past 2 days with radiation to the back and right wrist. Positive D-dimer. Evaluate for pulmonary embolism. EXAM: CT ANGIOGRAPHY CHEST WITH CONTRAST TECHNIQUE: Multidetector CT imaging of the chest was performed using the standard protocol during bolus administration of intravenous contrast. Multiplanar CT image reconstructions and MIPs were obtained to evaluate the vascular anatomy. CONTRAST:  77m OMNIPAQUE IOHEXOL 350 MG/ML SOLN COMPARISON:  No priors. FINDINGS: Cardiovascular: There are no filling defects within the pulmonary arterial tree to suggest underlying pulmonary embolism. Heart size is normal. There is no significant pericardial fluid, thickening or pericardial calcification. No atherosclerotic calcifications are noted in the thoracic aorta or the coronary arteries. Mediastinum/Nodes: No pathologically enlarged mediastinal or hilar lymph nodes. Esophagus is unremarkable in appearance. No axillary lymphadenopathy. Lungs/Pleura: Minimal subsegmental atelectasis noted in the left lower lobe. No acute consolidative airspace disease. No pleural effusions. No suspicious  appearing pulmonary nodules or masses are noted. Upper Abdomen: In segment 4A of the liver (axial image 72 of series 4 and coronal image 84 of series 7) there is a 3.5 x 2.5 x 2.4 cm heterogeneously enhancing lesion which is incompletely characterized on today's arterial phase examination. Musculoskeletal: There are no aggressive appearing lytic or  blastic lesions noted in the visualized portions of the skeleton. Review of the MIP images confirms the above findings. IMPRESSION: 1. No evidence of pulmonary embolism. 2. No acute findings to account for the patient's symptoms. 3. Large heterogeneously enhancing lesion in segment 4A of the liver, incompletely characterized on today's arterial phase examination. Further characterization with nonemergent abdominal MRI with and without IV gadolinium (preferably Eovist) is recommended in the near future. Electronically Signed   By: Vinnie Langton M.D.   On: 11/14/2020 06:43   ECHOCARDIOGRAM COMPLETE  Result Date: 11/14/2020    ECHOCARDIOGRAM REPORT   Patient Name:   BRITINI CAMPBELL-MANNING Date of Exam: 11/14/2020 Medical Rec #:  [id number]               Height:       65.0 in Accession #:    [id number]              Weight:       153.0 lb Date of Birth:  1995-08-14                BSA:          1.765 m Patient Age:    25 years                BP:           100/69 mmHg Patient Gender: F                       HR:           116 bpm. Exam Location:  Inpatient Procedure: 2D Echo, 3D Echo, Cardiac Doppler and Color Doppler STAT ECHO Indications:    Elevated Troponin  History:        Patient has no prior history of Echocardiogram examinations.                 Arrythmias:Tachycardia; Signs/Symptoms:Chest Pain. Ulcerative                 collitis. H/O Covid 19. Suspected myocarditis/pericarditis.                 Chest pain for two days.  Sonographer:    Merrie Roof RDCS Referring Phys: [id number] [place]  1. Left ventricular ejection fraction, by estimation, is 60 to  65%. Left ventricular ejection fraction by 3D volume is 57 %. The left ventricle has normal function. The left ventricle has no regional wall motion abnormalities. Left ventricular diastolic  parameters were normal.  2. Right ventricular systolic function is normal. The right ventricular size is normal.  3. The mitral valve is normal in structure. No evidence of mitral valve regurgitation. No evidence of mitral stenosis.  4. The aortic valve is normal in structure. Aortic valve regurgitation is not visualized. No aortic stenosis is present.  5. The inferior vena cava is normal in size with greater than 50% respiratory variability, suggesting right atrial pressure of 3 mmHg. FINDINGS  Left Ventricle: Left ventricular ejection fraction, by estimation, is 60 to 65%. Left ventricular ejection fraction by 3D volume is 57 %. The left ventricle has normal function. The left ventricle has no regional wall motion abnormalities. The left ventricular internal cavity size was normal in size. There is no left ventricular hypertrophy. Left ventricular diastolic parameters were normal. Normal left ventricular filling pressure. Right Ventricle: The right ventricular size is normal. No increase in right ventricular wall thickness. Right ventricular systolic function is normal. Left Atrium:  Left atrial size was normal in size. Right Atrium: Right atrial size was normal in size. Pericardium: There is no evidence of pericardial effusion. Mitral Valve: The mitral valve is normal in structure. No evidence of mitral valve regurgitation. No evidence of mitral valve stenosis. Tricuspid Valve: The tricuspid valve is normal in structure. Tricuspid valve regurgitation is not demonstrated. No evidence of tricuspid stenosis. Aortic Valve: The aortic valve is normal in structure. Aortic valve regurgitation is not visualized. No aortic stenosis is present. Aortic valve mean gradient measures 5.0 mmHg. Aortic valve peak gradient measures 8.8 mmHg.  Aortic valve area, by VTI measures 2.31 cm. Pulmonic Valve: The pulmonic valve was normal in structure. Pulmonic valve regurgitation is not visualized. No evidence of pulmonic stenosis. Aorta: The aortic root is normal in size and structure. Venous: The inferior vena cava is normal in size with greater than 50% respiratory variability, suggesting right atrial pressure of 3 mmHg. IAS/Shunts: No atrial level shunt detected by color flow Doppler.  LEFT VENTRICLE PLAX 2D LVIDd:         4.60 cm         Diastology LVIDs:         3.40 cm         LV e' medial:  12.30 cm/s LV PW:         0.90 cm         LV e' lateral: 12.90 cm/s LV IVS:        0.70 cm LVOT diam:     1.90 cm LV SV:         50              3D Volume EF LV SV Index:   28              LV 3D EF:    Left LVOT Area:     2.84 cm                     ventricul                                             ar                                             ejection                                             fraction                                             by 3D                                             volume is  57 %.                                 3D Volume EF:                                3D EF:        57 %                                LV EDV:       90 ml                                LV ESV:       39 ml                                LV SV:        51 ml RIGHT VENTRICLE RV Basal diam:  2.20 cm RV S prime:     12.10 cm/s TAPSE (M-mode): 1.6 cm LEFT ATRIUM             Index       RIGHT ATRIUM           Index LA diam:        3.00 cm 1.70 cm/m  RA Area:     15.70 cm LA Vol (A2C):   20.6 ml 11.67 ml/m RA Volume:   35.30 ml  20.00 ml/m LA Vol (A4C):   31.1 ml 17.62 ml/m LA Biplane Vol: 25.5 ml 14.45 ml/m  AORTIC VALVE AV Area (Vmax):    2.22 cm AV Area (Vmean):   2.26 cm AV Area (VTI):     2.31 cm AV Vmax:           148.00 cm/s AV Vmean:          104.000 cm/s AV VTI:            0.215 m AV Peak Grad:      8.8  mmHg AV Mean Grad:      5.0 mmHg LVOT Vmax:         116.00 cm/s LVOT Vmean:        83.000 cm/s LVOT VTI:          0.175 m LVOT/AV VTI ratio: 0.81  AORTA Ao Root diam: 2.40 cm Ao Asc diam:  1.90 cm  SHUNTS Systemic VTI:  0.18 m Systemic Diam: 1.90 cm Dani Gobble Croitoru MD Electronically signed by Sanda Klein MD Signature Date/Time: 11/14/2020/5:26:55 PM    Final     Assessment and Plan:   25 yo F with hx of Crohn's vs ulcerative colitis with chest pain off of steroids.  Acute Myopericarditis Hx of Crohn's Dx - ESR pending, CRP 21.3, Troponin curve 463-> 553->? (None drawn since transfer- reordered) - symptomatic, suspect this is related to her prednisone wean - CXR unremarkable - Echo notable for no significant effusion or WMA - Etiology concerning for autoimmune disease exacerbated from change in Crohn's mgmt - Rheumatologic work up is pending - Started on Ibuprofen 800 mg TID will take for two-4 weeks - Started on colchicine 0.6 mg BID with weight of ~ 70 kg - Post rheumatologic work  up and Crohn's disease w/u long term may need some sort of immunosuppression - low dose BB could be considered for residual pain - given troponin elevation, evaluation for myocarditis is warranted; will get CMR - Patient advised to limit physical activity to walking and light weight training (< 5kg) for at least 3 months and competitive sports for at least 6 months   Abdominal Mass with bilirubinemia - will defer to primary  For questions or updates, please contact [place] Please consult [email or web address] for contact info under     Signed, Werner Lean, MD  11/14/2020 5:55 PM

## 2020-11-14 NOTE — ED Notes (Signed)
[facility] here to get pt

## 2020-11-14 NOTE — Progress Notes (Signed)
Received a phone call from Facility: [facility]  Requesting MD: [facility] Patient with h/o IBD presenting with chest pain.  Pleuritic CP with elevated troponin, 463, 553.  +Tachycardia.  Had [place] in May, no recent illness.  Cardiology recommends ESR/CRP/Echo and they will consult, ?cardiac MRI.  UDS also ordered.  CTA negative for PE but shows liver lesion that needs further evaluation.  Will add LFTs on to labs.  Plan of care: Admit to telemetry - needs Echo, cardiology consultation, and liver MRI. The patient will be accepted for admission to telemetry at [facility] when bed is available.    Nursing staff, Please call the [facility] number at the top of Amion at the time of the patient's arrival so that the patient can be paged to the admitting physician.   Carlyon Shadow, M.D. [facility]

## 2020-11-14 NOTE — ED Notes (Signed)
Pt states pain is better

## 2020-11-14 NOTE — ED Provider Notes (Signed)
[facility] DEPART[facility] Provider Note   CSN: [id number] Arrival date & time: 11/14/20  0443     History Chief Complaint  Patient presents with   Chest Pain    Vibha Ferdig is a 25 y.o. female.  HPI     This is a 25 year old female with a history of Crohn's disease and migraines who presents with chest pain.  Patient reports 2-day history of chest pain.  She describes sharp nonradiating chest pain over the anterior chest.  It is worse with movements and deep breathing.  No recent fevers or cough.  She is not taking anything for her pain.  She rates her pain at 10 out of 10.  She has not noted any leg swelling, history of blood clots, estrogen use.  Denies recent upper respiratory infections.  Past Medical History:  Diagnosis Date   Crohn's disease ([place])    Frequent headaches    Migraine    Ulcerative colitis ([place])     There are no problems to display for this patient.   History reviewed. No pertinent surgical history.   OB History   No obstetric history on file.     History reviewed. No pertinent family history.  Social History   Tobacco Use   Smoking status: Never   Smokeless tobacco: Never  Vaping Use   Vaping Use: Never used  Substance Use Topics   Alcohol use: No   Drug use: No    Home Medications Prior to Admission medications   Medication Sig Start Date End Date Taking? Authorizing Provider  ciprofloxacin (CIPRO) 500 MG tablet Take 1 tablet (500 mg total) by mouth 2 (two) times daily. 10/07/20   Mesner, Corene Cornea, MD  mesalamine (LIALDA) 1.2 g EC tablet Take 2 tablets (2.4 g total) by mouth daily with breakfast. 10/31/20 12/30/20  Godfrey Pick, MD  nirmatrelvir/ritonavir EUA (PAXLOVID) TABS Patient GFR is >90. Take nirmatrelvir (150 mg) 2 tablet(s) twice daily for 5 days and ritonavir (100 mg) one tablet twice daily for 5 days. 08/08/20   Luretha Eberly, Barbette Hair, MD  ondansetron (ZOFRAN ODT) 4 MG disintegrating tablet Take 1 tablet (4 mg  total) by mouth every 8 (eight) hours as needed for up to 12 doses for nausea or vomiting. 10/31/20   Godfrey Pick, MD  oxyCODONE-acetaminophen (PERCOCET) 5-325 MG tablet Take 1-2 tablets by mouth every 6 (six) hours as needed. 10/25/20   Veryl Speak, MD  predniSONE (DELTASONE) 20 MG tablet 2 tabs po daily x 5 days, then 1 tabs x 10 days, then 1/2 tabs x 10 days 10/31/20   Godfrey Pick, MD    Allergies    Hydrocodone-acetaminophen and Morphine and related  Review of Systems   Review of Systems  Constitutional:  Negative for fever.  Respiratory:  Negative for cough and shortness of breath.   Cardiovascular:  Positive for chest pain. Negative for leg swelling.  Gastrointestinal:  Negative for abdominal pain, nausea and vomiting.  All other systems reviewed and are negative.  Physical Exam Updated Vital Signs BP 106/63   Pulse (!) 112   Temp 99.7 F (37.6 C) (Oral)   Resp 16   Ht 1.651 m (5' 5" )   Wt 69.4 kg   LMP  (LMP Unknown)   SpO2 100%   BMI 25.46 kg/m   Physical Exam Vitals and nursing note reviewed.  Constitutional:      Appearance: She is well-developed.  HENT:     Head: Normocephalic and atraumatic.  Eyes:  Pupils: Pupils are equal, round, and reactive to light.  Cardiovascular:     Rate and Rhythm: Regular rhythm. Tachycardia present.     Heart sounds: Normal heart sounds.  Pulmonary:     Effort: Pulmonary effort is normal. No respiratory distress.     Breath sounds: No wheezing.  Chest:     Chest wall: Tenderness present.  Abdominal:     General: Bowel sounds are normal.     Palpations: Abdomen is soft.  Musculoskeletal:     Cervical back: Neck supple.  Skin:    General: Skin is warm and dry.  Neurological:     Mental Status: She is alert and oriented to person, place, and time.  Psychiatric:        Mood and Affect: Mood is anxious.    ED Results / Procedures / Treatments   Labs (all labs ordered are listed, but only abnormal results are  displayed) Labs Reviewed  CBC WITH DIFFERENTIAL/PLATELET - Abnormal; Notable for the following components:      Result Value   Hemoglobin 9.9 (*)    HCT 32.4 (*)    MCV 75.3 (*)    MCH 23.0 (*)    RDW 17.4 (*)    Platelets 633 (*)    All other components within normal limits  BASIC METABOLIC PANEL - Abnormal; Notable for the following components:   Glucose, Bld 148 (*)    Calcium 8.8 (*)    All other components within normal limits  D-DIMER, QUANTITATIVE - Abnormal; Notable for the following components:   D-Dimer, Quant 1.88 (*)    All other components within normal limits  TROPONIN I (HIGH SENSITIVITY) - Abnormal; Notable for the following components:   Troponin I (High Sensitivity) 463 (*)    All other components within normal limits  RESP PANEL BY RT-PCR (FLU A&B, COVID) ARPGX2  URINALYSIS, ROUTINE W REFLEX MICROSCOPIC  PREGNANCY, URINE  TROPONIN I (HIGH SENSITIVITY)    EKG EKG Interpretation  Date/Time:  Tuesday November 14 2020 04:53:04 EDT Ventricular Rate:  134 PR Interval:  125 QRS Duration: 73 QT Interval:  261 QTC Calculation: 390 R Axis:   62 Text Interpretation: Sinus tachycardia Borderline T abnormalities, diffuse leads Confirmed by Thayer Jew [phone]) on 11/14/2020 5:07:00 AM  Radiology DG Chest 2 View  Result Date: 11/14/2020 CLINICAL DATA:  25 year old female with history of chest pain. Pain radiating into the back and down into the right wrist. Pain worsens with movement. EXAM: CHEST - 2 VIEW COMPARISON:  Chest x-ray 03/06/2016. FINDINGS: Lung volumes are normal. No consolidative airspace disease. No pleural effusions. No pneumothorax. No pulmonary nodule or mass noted. Pulmonary vasculature and the cardiomediastinal silhouette are within normal limits. IMPRESSION: No radiographic evidence of acute cardiopulmonary disease. Electronically Signed   By: Vinnie Langton M.D.   On: 11/14/2020 05:51   CT Angio Chest PE W and/or Wo Contrast  Result Date:  11/14/2020 CLINICAL DATA:  25 year old female with sharp midline chest pain for the past 2 days with radiation to the back and right wrist. Positive D-dimer. Evaluate for pulmonary embolism. EXAM: CT ANGIOGRAPHY CHEST WITH CONTRAST TECHNIQUE: Multidetector CT imaging of the chest was performed using the standard protocol during bolus administration of intravenous contrast. Multiplanar CT image reconstructions and MIPs were obtained to evaluate the vascular anatomy. CONTRAST:  42m OMNIPAQUE IOHEXOL 350 MG/ML SOLN COMPARISON:  No priors. FINDINGS: Cardiovascular: There are no filling defects within the pulmonary arterial tree to suggest underlying pulmonary embolism. Heart size is  normal. There is no significant pericardial fluid, thickening or pericardial calcification. No atherosclerotic calcifications are noted in the thoracic aorta or the coronary arteries. Mediastinum/Nodes: No pathologically enlarged mediastinal or hilar lymph nodes. Esophagus is unremarkable in appearance. No axillary lymphadenopathy. Lungs/Pleura: Minimal subsegmental atelectasis noted in the left lower lobe. No acute consolidative airspace disease. No pleural effusions. No suspicious appearing pulmonary nodules or masses are noted. Upper Abdomen: In segment 4A of the liver (axial image 72 of series 4 and coronal image 84 of series 7) there is a 3.5 x 2.5 x 2.4 cm heterogeneously enhancing lesion which is incompletely characterized on today's arterial phase examination. Musculoskeletal: There are no aggressive appearing lytic or blastic lesions noted in the visualized portions of the skeleton. Review of the MIP images confirms the above findings. IMPRESSION: 1. No evidence of pulmonary embolism. 2. No acute findings to account for the patient's symptoms. 3. Large heterogeneously enhancing lesion in segment 4A of the liver, incompletely characterized on today's arterial phase examination. Further characterization with nonemergent abdominal MRI  with and without IV gadolinium (preferably Eovist) is recommended in the near future. Electronically Signed   By: Vinnie Langton M.D.   On: 11/14/2020 06:43    Procedures .Critical Care  Date/Time: 11/14/2020 6:53 AM Performed by: Merryl Hacker, MD Authorized by: Merryl Hacker, MD   Critical care provider statement:    Critical care time (minutes):  35   Critical care was time spent personally by me on the following activities:  Discussions with consultants, evaluation of patient's response to treatment, examination of patient, ordering and performing treatments and interventions, ordering and review of laboratory studies, ordering and review of radiographic studies, pulse oximetry, re-evaluation of patient's condition, obtaining history from patient or surrogate and review of old charts   Medications Ordered in ED Medications  ondansetron (ZOFRAN) injection 4 mg (4 mg Intravenous Given 11/14/20 0523)  fentaNYL (SUBLIMAZE) injection 100 mcg (100 mcg Intravenous Given 11/14/20 0523)  sodium chloride 0.9 % bolus 1,000 mL (1,000 mLs Intravenous New Bag/Given 11/14/20 0538)  iohexol (OMNIPAQUE) 350 MG/ML injection 75 mL (75 mLs Intravenous Contrast Given 11/14/20 0616)  HYDROmorphone (DILAUDID) injection 1 mg (1 mg Intravenous Given 11/14/20 2706)    ED Course  I have reviewed the triage vital signs and the nursing notes.  Pertinent labs & imaging results that were available during my care of the patient were reviewed by me and considered in my medical decision making (see chart for details).  Clinical Course as of 11/14/20 0651  Tue Nov 14, 2020  2376 Troponin elevated.  Low risk for ACS and EKG is without ischemic changes.  Higher suspicion for secondary cause for troponin elevation such as pulmonary embolism or pericarditis.  No recent fevers or URI symptoms to suggest myocarditis. [CH]    Clinical Course User Index [CH] Lynnlee Revels, Barbette Hair, MD   MDM Rules/Calculators/A&P                            Patient presents with chest discomfort over the last 2 days.  She is notably tachycardic.  She is not hypoxic.  Blood pressures 28B to 151V systolic.  She would be low risk for ACS.  EKG shows sinus tachycardia without evidence of acute ischemia or arrhythmia.  Chest x-ray without pneumothorax or pneumonia.  D-dimer was elevated at 1.88.  For this reason, CT scan was obtained to rule out PE.  This was largely reassuring.  She does have liver abnormality that needs further testing.  Initial troponin came back fairly significantly elevated greater than 400.  Doubt true ACS but would consider secondary causes for troponin elevated such as myocarditis or pericarditis given absence of PE.  I discussed the results with the patient.  Feel she will likely need admission.  Will await second troponin to trend prior to admitting to the hospitalist.  Final Clinical Impression(s) / ED Diagnoses Final diagnoses:  Atypical chest pain  Elevated troponin    Rx / DC Orders ED Discharge Orders     None        Merryl Hacker, MD 11/14/20 [phone]

## 2020-11-14 NOTE — Progress Notes (Signed)
[facility] night shift telemetry coverage note.  The nursing staff reported that the patient is nauseated and asked for Zofran ODT which she normally takes at home when she gets nauseated.  I have added this medication to her MAR.  Tennis Must, MD.

## 2020-11-14 NOTE — ED Triage Notes (Signed)
Pt is c/o chest pain x 2 days  Pt states the pain is in the middle of her chest and is sharp in nature  States pain radiates into her back and down into her right wrist  Pt states pain is worse with movement

## 2020-11-14 NOTE — ED Notes (Signed)
Patient transported to CT 

## 2020-11-14 NOTE — ED Notes (Signed)
Pt back from bathroom and states that pain is  Bad. Pt to be admitted spec to lab

## 2020-11-14 NOTE — ED Notes (Signed)
Pt up to BR to void transported in bed assisted in to BR pt still in pain

## 2020-11-14 NOTE — ED Notes (Signed)
Patient transported to X-ray 

## 2020-11-15 ENCOUNTER — Inpatient Hospital Stay (HOSPITAL_COMMUNITY): Payer: Medicaid Other

## 2020-11-15 DIAGNOSIS — D508 Other iron deficiency anemias: Secondary | ICD-10-CM

## 2020-11-15 DIAGNOSIS — K50919 Crohn's disease, unspecified, with unspecified complications: Secondary | ICD-10-CM

## 2020-11-15 DIAGNOSIS — K5100 Ulcerative (chronic) pancolitis without complications: Secondary | ICD-10-CM

## 2020-11-15 DIAGNOSIS — K769 Liver disease, unspecified: Secondary | ICD-10-CM | POA: Diagnosis not present

## 2020-11-15 DIAGNOSIS — R079 Chest pain, unspecified: Secondary | ICD-10-CM | POA: Diagnosis not present

## 2020-11-15 DIAGNOSIS — D509 Iron deficiency anemia, unspecified: Secondary | ICD-10-CM

## 2020-11-15 LAB — CBC WITH DIFFERENTIAL/PLATELET
Abs Immature Granulocytes: 0.04 10*3/uL (ref 0.00–0.07)
Basophils Absolute: 0 10*3/uL (ref 0.0–0.1)
Basophils Relative: 0 %
Eosinophils Absolute: 0.1 10*3/uL (ref 0.0–0.5)
Eosinophils Relative: 1 %
HCT: 28.9 % — ABNORMAL LOW (ref 36.0–46.0)
Hemoglobin: 8.8 g/dL — ABNORMAL LOW (ref 12.0–15.0)
Immature Granulocytes: 1 %
Lymphocytes Relative: 28 %
Lymphs Abs: 1.8 10*3/uL (ref 0.7–4.0)
MCH: 22.9 pg — ABNORMAL LOW (ref 26.0–34.0)
MCHC: 30.4 g/dL (ref 30.0–36.0)
MCV: 75.3 fL — ABNORMAL LOW (ref 80.0–100.0)
Monocytes Absolute: 0.9 10*3/uL (ref 0.1–1.0)
Monocytes Relative: 13 %
Neutro Abs: 3.7 10*3/uL (ref 1.7–7.7)
Neutrophils Relative %: 57 %
Platelets: 529 10*3/uL — ABNORMAL HIGH (ref 150–400)
RBC: 3.84 MIL/uL — ABNORMAL LOW (ref 3.87–5.11)
RDW: 17.2 % — ABNORMAL HIGH (ref 11.5–15.5)
WBC: 6.4 10*3/uL (ref 4.0–10.5)
nRBC: 0 % (ref 0.0–0.2)

## 2020-11-15 LAB — ANA W/REFLEX IF POSITIVE: Anti Nuclear Antibody (ANA): NEGATIVE

## 2020-11-15 LAB — URINALYSIS, ROUTINE W REFLEX MICROSCOPIC
Bilirubin Urine: NEGATIVE
Glucose, UA: >=500 mg/dL — AB
Ketones, ur: 5 mg/dL — AB
Leukocytes,Ua: NEGATIVE
Nitrite: NEGATIVE
Protein, ur: NEGATIVE mg/dL
Specific Gravity, Urine: 1.008 (ref 1.005–1.030)
pH: 7 (ref 5.0–8.0)

## 2020-11-15 LAB — COMPREHENSIVE METABOLIC PANEL
ALT: 20 U/L (ref 0–44)
AST: 16 U/L (ref 15–41)
Albumin: 2.1 g/dL — ABNORMAL LOW (ref 3.5–5.0)
Alkaline Phosphatase: 49 U/L (ref 38–126)
Anion gap: 8 (ref 5–15)
BUN: 6 mg/dL (ref 6–20)
CO2: 19 mmol/L — ABNORMAL LOW (ref 22–32)
Calcium: 8 mg/dL — ABNORMAL LOW (ref 8.9–10.3)
Chloride: 106 mmol/L (ref 98–111)
Creatinine, Ser: 0.67 mg/dL (ref 0.44–1.00)
GFR, Estimated: >60 mL/min (ref >60)
Glucose, Bld: 98 mg/dL (ref 70–99)
Potassium: 3.7 mmol/L (ref 3.5–5.1)
Sodium: 133 mmol/L — ABNORMAL LOW (ref 135–145)
Total Bilirubin: 0.5 mg/dL (ref 0.3–1.2)
Total Protein: 5 g/dL — ABNORMAL LOW (ref 6.5–8.1)

## 2020-11-15 LAB — PROCALCITONIN: Procalcitonin: <0.1 ng/mL

## 2020-11-15 LAB — PROTIME-INR
INR: 1.1 (ref 0.8–1.2)
Prothrombin Time: 14.3 seconds (ref 11.4–15.2)

## 2020-11-15 LAB — APTT: aPTT: 27 seconds (ref 24–36)

## 2020-11-15 LAB — LACTIC ACID, PLASMA: Lactic Acid, Venous: 1.3 mmol/L (ref 0.5–1.9)

## 2020-11-15 IMAGING — MR MR ABDOMEN WO/W CM
10 of 18 series · 23 of 48 positions shown · IV contrast (7 GAD)
Comparison: [DATE] chest CT angiogram. [DATE] CT
abdomen/pelvis.

CLINICAL DATA: Inpatient. Fever and hypotension. Inflammatory bowel
disease diagnosed in [6A], lost to follow-up. Indeterminate left
liver mass on chest CT angiogram from yesterday, concern for
abscess. Abdominal abscess/infection suspected

EXAM:
MRI ABDOMEN WITHOUT AND WITH CONTRAST
TECHNIQUE: Multiplanar multisequence MR imaging of the abdomen was performed
both before and after the administration of intravenous contrast.
CONTRAST:  7mL GADAVIST GADOBUTROL 1 MMOL/ML IV SOLN

[Series 4: ax ssfse nav · axial · 6.0mm · 0.74mm/px · z∈[+35,+233]mm · 2 of 34 slices shown]
[im 1/34]
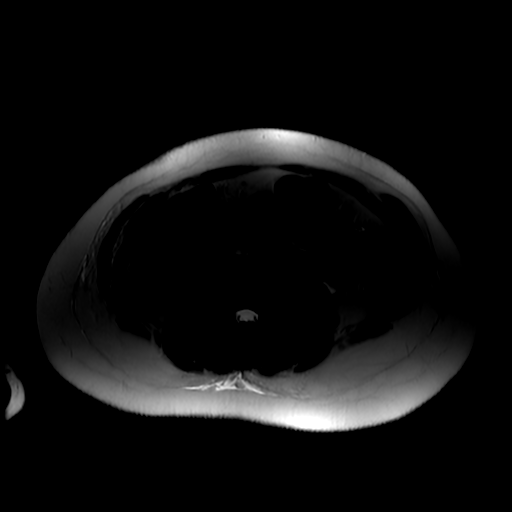
[im 34/34]
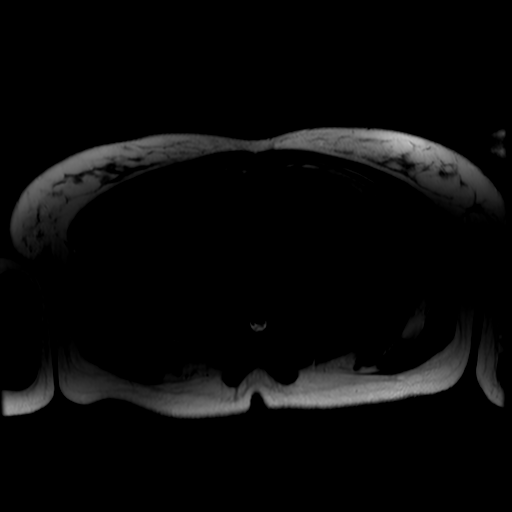

[Series 5: T2 fat-sat · axial · 6.0mm · 0.74mm/px · z∈[+35,+233]mm · 2 of 34 slices shown]
[im 1/34]
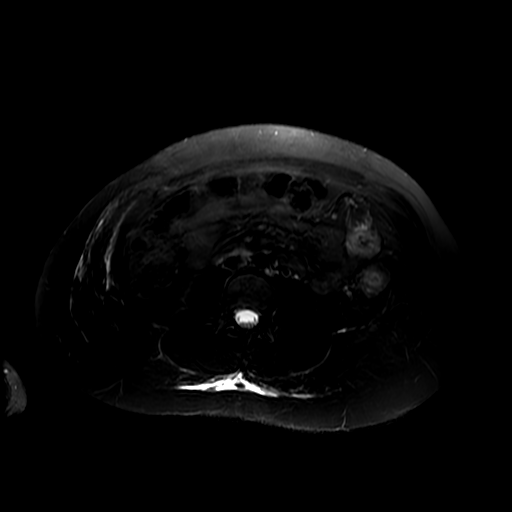
[im 34/34]
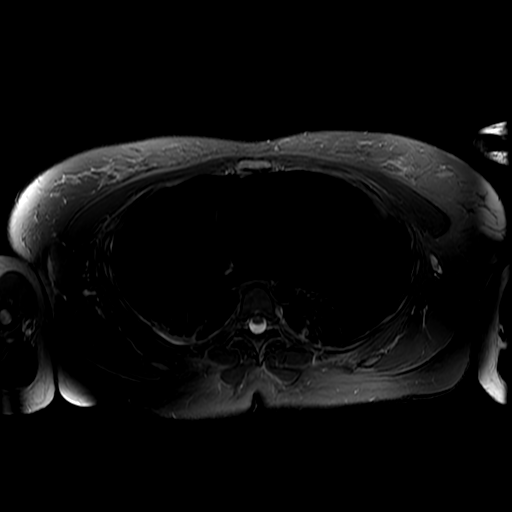

[Series 6: DWI b500 · axial · 8.0mm · 1.76mm/px · z∈[-10,+299]mm · 2 of 62 slices shown]
[im 1/62]
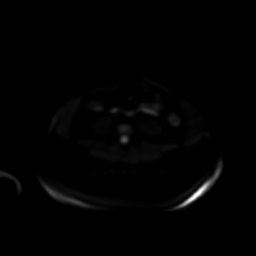
[im 62/62]
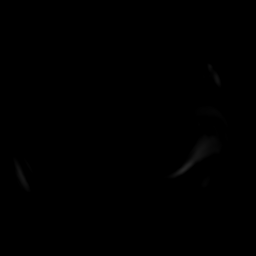

[Series 7: cor ssfse nav · coronal · 6.0mm · 0.78mm/px · 1 of 29 slices shown]
[im 1/29]
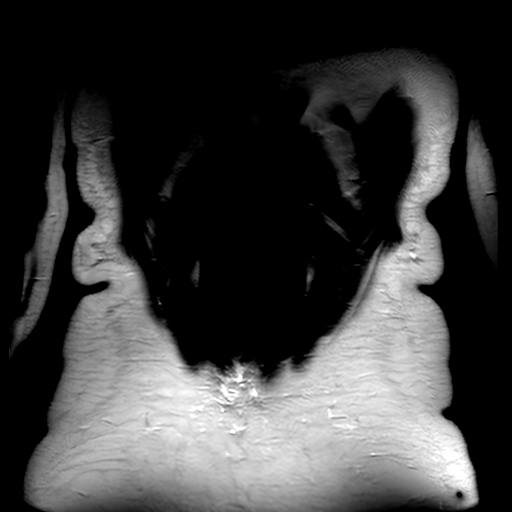

[Series 10: T1 dynamic · coronal · 3.4mm · 1.56mm/px · 4 of 120 slices shown]
[im 1/120]
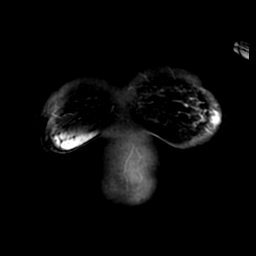
[im 40/120]
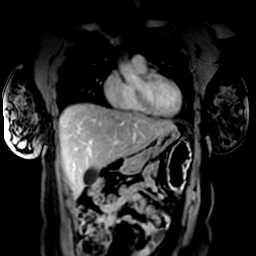
[im 80/120]
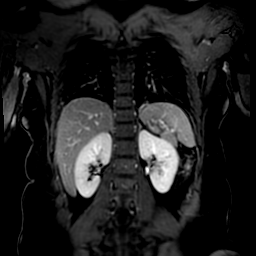
[im 120/120]
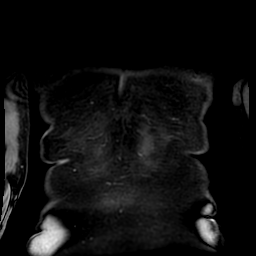

[Series 650: ADC · axial · 8.0mm · 1.76mm/px · 1 of 32 slices shown]
[im 1/32]
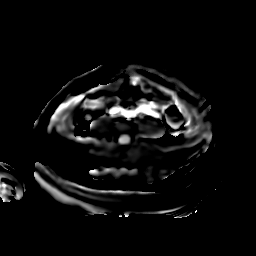

[Series 900: T1 dynamic post-contrast · axial · non-contrast · 4.0mm · 0.86mm/px · z∈[+27,+233]mm · 3 of 104 slices shown (1 of 4)]
[im 1/104]
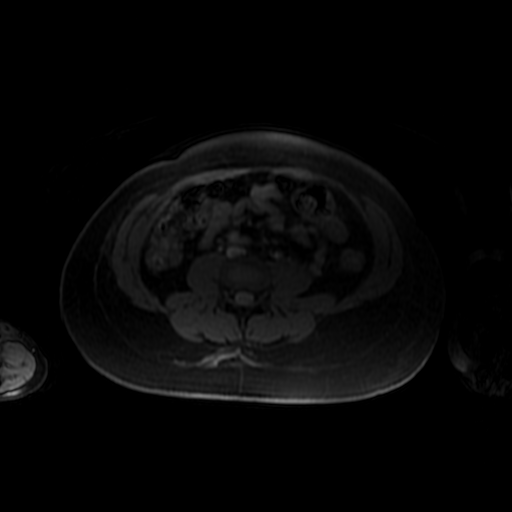
[im 52/104]
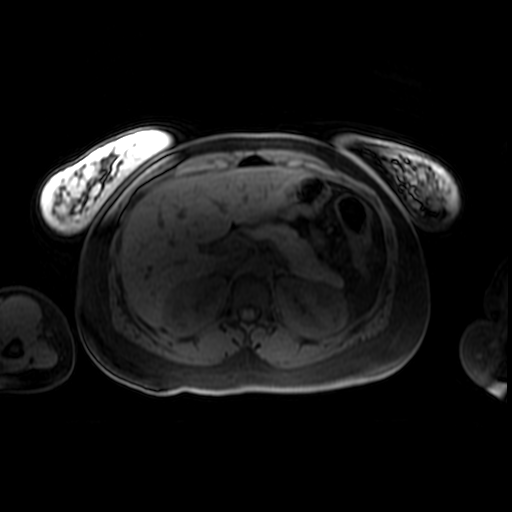
[im 104/104]
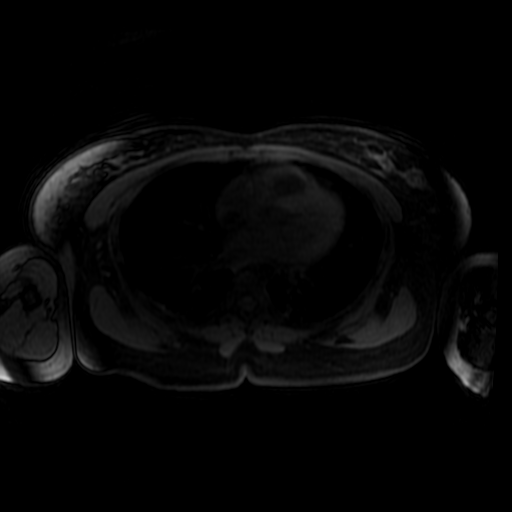

[Series 901: T1 dynamic post-contrast · axial · non-contrast · 4.0mm · 0.86mm/px · z∈[+27,+233]mm · 3 of 104 slices shown (2 of 4)]
[im 1/104]
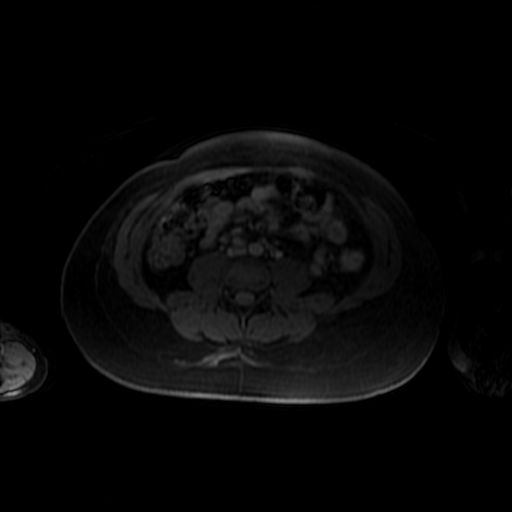
[im 52/104]
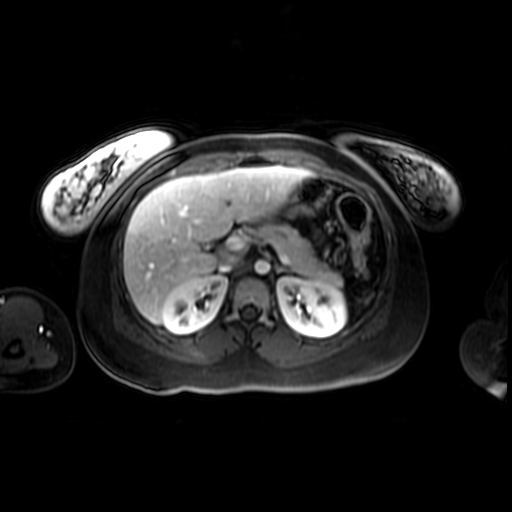
[im 104/104]
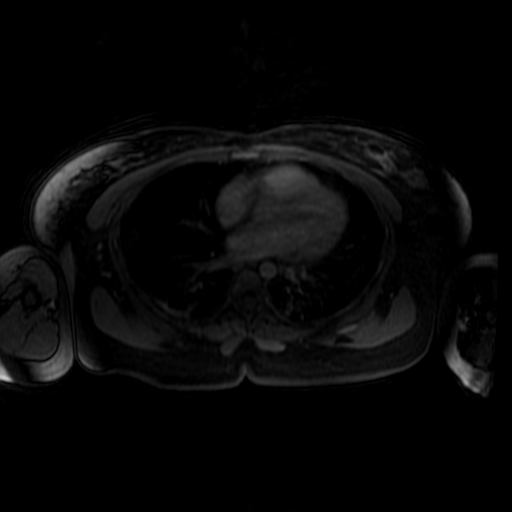

[Series 902: T1 dynamic post-contrast · axial · non-contrast · 4.0mm · 0.86mm/px · z∈[+27,+233]mm · 3 of 104 slices shown (3 of 4)]
[im 1/104]
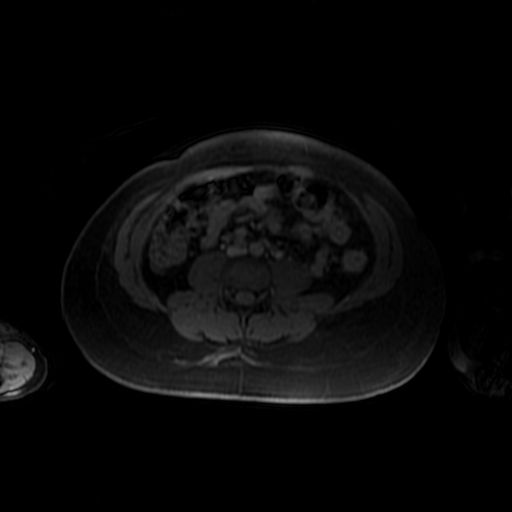
[im 52/104]
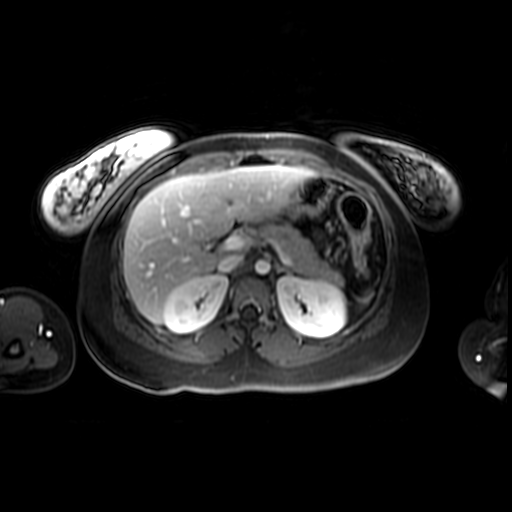
[im 104/104]
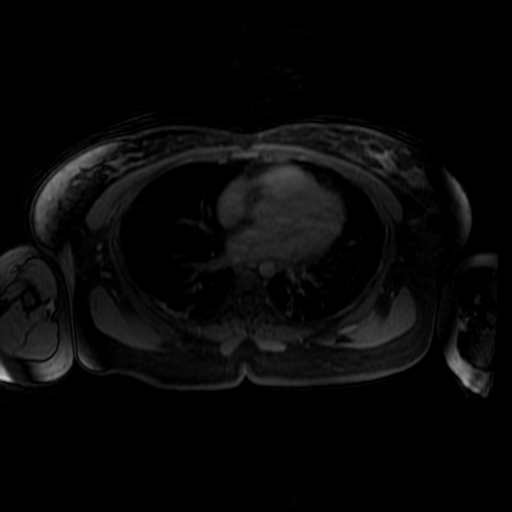

[Series 903: T1 dynamic post-contrast · axial · non-contrast · 4.0mm · 0.86mm/px · z∈[+27,+129]mm · 2 of 104 slices shown (4 of 4)]
[im 1/104]
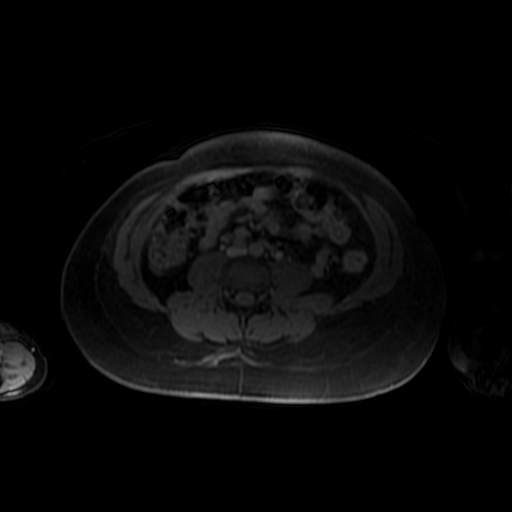
[im 52/104]
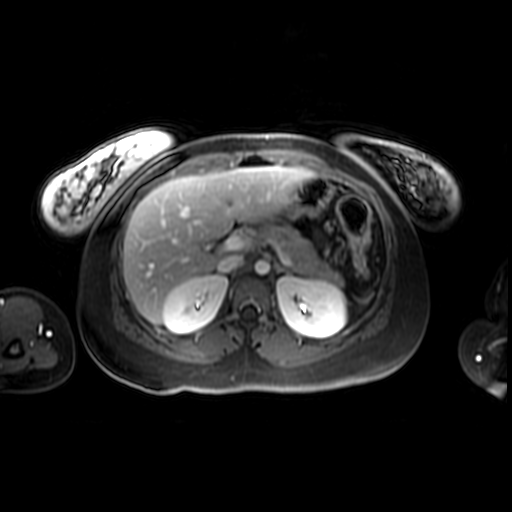

[23 of 48 positions shown; findings below may reference images not displayed]

FINDINGS: Lower chest: Hypoventilatory changes at the dependent lung bases.

Hepatobiliary: Normal liver size and configuration. No hepatic
steatosis. There is a 3.5 x 2.9 cm segment 4A left liver mass
(series 901/image 45) with avid heterogeneous nodular arterial phase
enhancement, relative isoenhancement on portal venous phase and
relatively occult appearance on T1 and T2 imaging without evidence
of internal lipid on chemical shift imaging, all most compatible
with focal nodular hyperplasia. No additional liver masses. Normal
gallbladder with no cholelithiasis. No biliary ductal dilatation.
Common bile duct diameter 3 mm. No choledocholithiasis. No biliary
masses, strictures or beading.

Pancreas: No pancreatic mass or duct dilation.  No pancreas divisum.

Spleen: Normal size. No mass.

Adrenals/Urinary Tract: Normal adrenals. No hydronephrosis. Normal
kidneys with no renal mass.

Stomach/Bowel: Normal non-distended stomach. Normal caliber
visualized small and large bowel loops. Mild generalized wall
thickening and hyperenhancement in the visualized left colon without
evidence of acute pericolonic inflammatory changes.

Vascular/Lymphatic: Normal caliber abdominal aorta. Patent portal,
splenic, hepatic and renal veins. No pathologically enlarged lymph
nodes in the abdomen.

Other: No abdominal ascites or focal fluid collection.

Musculoskeletal: No aggressive appearing focal osseous lesions.
IMPRESSION: 1. Hypervascular segment 4A left liver mass measuring 3.5 x 2.9 cm
with MRI features most compatible with focal nodular hyperplasia
(FNH). Follow-up MRI abdomen without and with IV contrast
recommended in 6 months, to be performed with EOVIST IV contrast. No
evidence of a liver abscess.
2. Mild generalized wall thickening and hyperenhancement in the
visualized left colon without evidence of acute pericolonic
inflammatory changes, compatible with known inflammatory bowel
disease.

## 2020-11-15 IMAGING — DX DG CHEST 1V PORT
1 series · 1 of 1 positions shown · non-contrast
Comparison: Chest radiograph dated [DATE] and CT dated
[DATE]

CLINICAL DATA: Sepsis.

EXAM:
PORTABLE CHEST 1 VIEW

[chest ap]
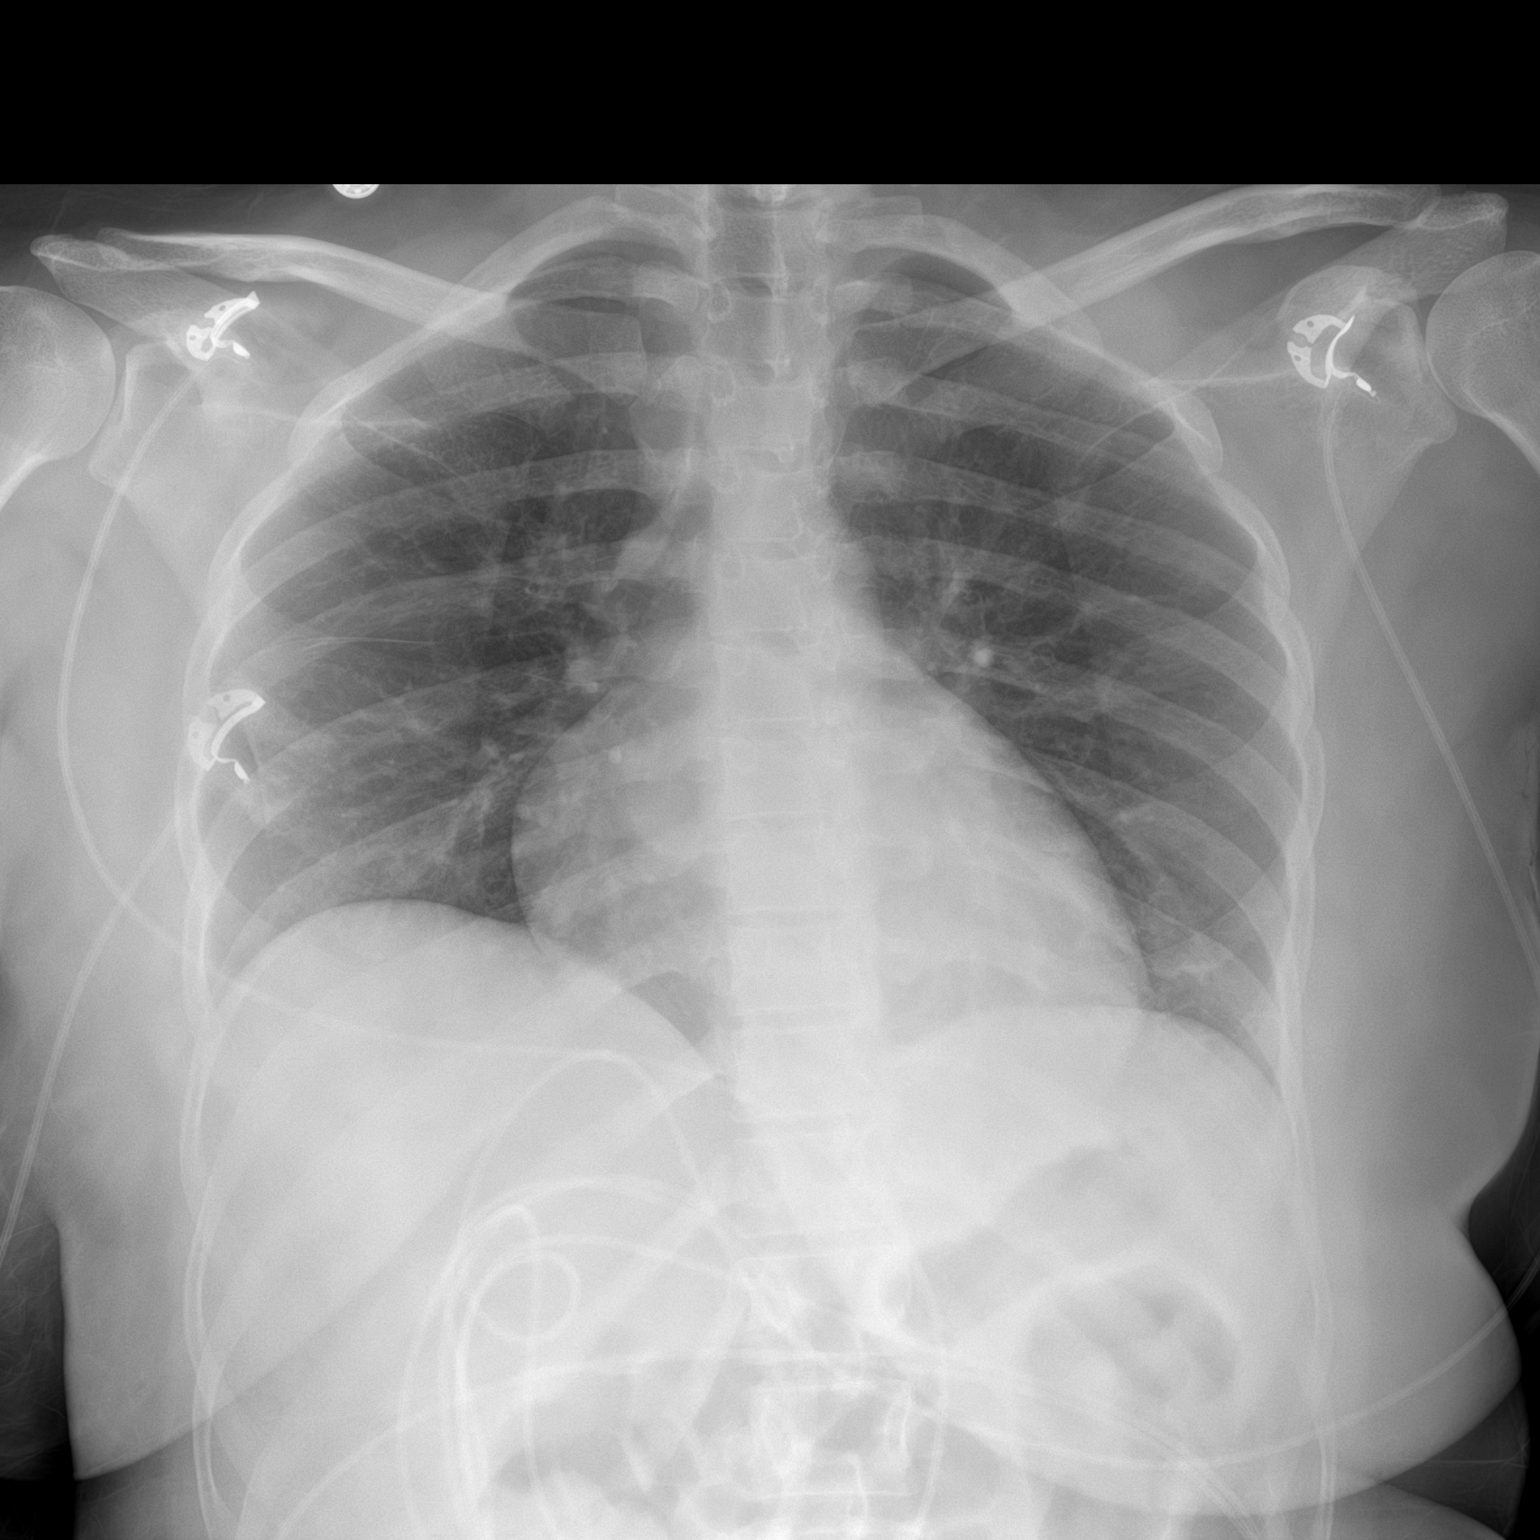

[1 of 1 positions shown; findings below may reference images not displayed]

FINDINGS: The heart size and mediastinal contours are within normal limits.
Both lungs are clear. The visualized skeletal structures are
unremarkable.
IMPRESSION: No active disease.

## 2020-11-15 MED ORDER — MESALAMINE 1.2 G PO TBEC
2.4000 g | DELAYED_RELEASE_TABLET | Freq: Every day | ORAL | Status: DC
  Administered 2020-11-15 – 2020-11-17 (×3): 2.4 g via ORAL
  Filled 2020-11-15 (×3): qty 2

## 2020-11-15 MED ORDER — KETOROLAC TROMETHAMINE 15 MG/ML IJ SOLN
15.0000 mg | Freq: Once | INTRAMUSCULAR | Status: AC
  Administered 2020-11-15: 15 mg via INTRAVENOUS
  Filled 2020-11-15: qty 1

## 2020-11-15 MED ORDER — LACTATED RINGERS IV BOLUS
1000.0000 mL | Freq: Once | INTRAVENOUS | Status: AC
  Administered 2020-11-15: 1000 mL via INTRAVENOUS

## 2020-11-15 MED ORDER — GADOBUTROL 1 MMOL/ML IV SOLN
7.0000 mL | Freq: Once | INTRAVENOUS | Status: AC | PRN
  Administered 2020-11-15: 7 mL via INTRAVENOUS

## 2020-11-15 MED ORDER — PREDNISONE 20 MG PO TABS
40.0000 mg | ORAL_TABLET | Freq: Every day | ORAL | Status: DC
  Administered 2020-11-15 – 2020-11-16 (×2): 40 mg via ORAL
  Filled 2020-11-15 (×2): qty 2

## 2020-11-15 MED ORDER — PANTOPRAZOLE SODIUM 40 MG PO TBEC
40.0000 mg | DELAYED_RELEASE_TABLET | Freq: Every day | ORAL | Status: DC
  Administered 2020-11-15 – 2020-11-16 (×2): 40 mg via ORAL
  Filled 2020-11-15 (×2): qty 1

## 2020-11-15 MED ORDER — LACTATED RINGERS IV BOLUS (SEPSIS)
1000.0000 mL | Freq: Once | INTRAVENOUS | Status: AC
  Administered 2020-11-15: 1000 mL via INTRAVENOUS

## 2020-11-15 MED ORDER — LACTATED RINGERS IV SOLN: INTRAVENOUS | Status: AC

## 2020-11-15 NOTE — Progress Notes (Addendum)
Brief note Has longstanding hx of UC or Crohn's-diagnosed in 2016-lost to follow up. Has been going to ED and getting prednisone. From what she tells me-and after reviewing chart with Pharmacy-looks like patient has been on prednisone continuously from 10/07/20 (ATLEAST)-and abruptly stopped it 2-3 days back (prednisone 20 mg) as she started feeling lousy-and started having arthralgia involving her Right MCP joint, right shoulder and then developed chest pain (reproducible).CTA showed possible enchancing liver lesion. She was febrile and hypotensive overnight and responded to fluid boluses.  Given the fact that she is now febrile with liver lesion (??abscess)-and has been on prednisone continuously since 7/16-suspect she will need to be restarted on steroids. I Have touched base with both GI and Cards. Will start prednisone-40 mg given her physiologic stress. Will await input from cards and GI. Also she has been refusing her Ibuprofen-as it "flares her IBD"  I have ordered MRI liver w and without contrast as well.  Full note to follow.

## 2020-11-15 NOTE — Progress Notes (Signed)
Progress Note  Patient Name: Robin Fuller Date of Encounter: 11/15/2020  Primary Cardiologist: New to [facility] MD  Subjective   Hypotension overnight that responded to IVF.  Primary team has started infectious work up evaluation.  Troponin has peaked.  Patient notes that her chest pain is more bearable.  She notes that ibuprofen has not improvement the pain (unclear if she received it, presently it appears that she has refused the medication because it historically upset her stomach).  Inpatient Medications    Scheduled Meds:  colchicine  0.6 mg Oral BID   ibuprofen  600 mg Oral TID   mesalamine  2.4 g Oral Q breakfast   Continuous Infusions:  lactated ringers 125 mL/hr at 11/15/20 0320   PRN Meds: HYDROmorphone (DILAUDID) injection, ondansetron   Vital Signs    Vitals:   11/15/20 0148 11/15/20 0202 11/15/20 0249 11/15/20 0403  BP:  96/69 99/62 105/60  Pulse:  (!) 115 (!) 127 (!) 127  Resp:  20 20 18   Temp:  98.5 F (36.9 C) 98.6 F (37 C) 98.7 F (37.1 C)  TempSrc:  Oral Oral Oral  SpO2:  100% 100% 100%  Weight: 70.4 kg     Height:        Intake/Output Summary (Last 24 hours) at 11/15/2020 0735 Last data filed at 11/15/2020 0204 Gross per 24 hour  Intake 480 ml  Output --  Net 480 ml   Filed Weights   11/14/20 0450 11/15/20 0148  Weight: 69.4 kg 70.4 kg    Telemetry    Sinus Tachycardia - Personally Reviewed  ECG    11/14/20 Sinus tachycardia rate 118 PR depression - Personally Reviewed  Physical Exam   GEN: No acute distress.   Neck: No JVD Cardiac: Regular tachycardia, no murmurs, no rubs, or gallops.  Respiratory: Clear to auscultation bilaterally. GI: Soft, nontender, non-distended  MS: No edema; No deformity. Neuro:  Nonfocal  Psych: Normal affect   Labs    Chemistry Recent Labs  Lab 11/14/20 0520 11/14/20 0922 11/15/20 0122  NA 135  --  133*  K 3.5  --  3.7  CL 101  --  106  CO2 23  --  19*  GLUCOSE 148*  --   98  BUN 10  --  6  CREATININE 0.87  --  0.67  CALCIUM 8.8*  --  8.0*  PROT  --  6.4* 5.0*  ALBUMIN  --  2.9* 2.1*  AST  --  66* 16  ALT  --  27 20  ALKPHOS  --  66 49  BILITOT  --  1.8* 0.5  GFRNONAA >60  --  >60  ANIONGAP 11  --  8     Hematology Recent Labs  Lab 11/14/20 0520 11/15/20 0122  WBC 8.0 6.4  RBC 4.30 3.84*  HGB 9.9* 8.8*  HCT 32.4* 28.9*  MCV 75.3* 75.3*  MCH 23.0* 22.9*  MCHC 30.6 30.4  RDW 17.4* 17.2*  PLT 633* 529*    Cardiac EnzymesNo results for input(s): TROPONINI in the last 168 hours. No results for input(s): TROPIPOC in the last 168 hours.   BNPNo results for input(s): BNP, PROBNP in the last 168 hours.   DDimer  Recent Labs  Lab 11/14/20 0520  DDIMER 1.88*     Radiology    DG Chest 2 View  Result Date: 11/14/2020 CLINICAL DATA:  25 year old female with history of chest pain. Pain radiating into the back and down into the right wrist.  Pain worsens with movement. EXAM: CHEST - 2 VIEW COMPARISON:  Chest x-ray 03/06/2016. FINDINGS: Lung volumes are normal. No consolidative airspace disease. No pleural effusions. No pneumothorax. No pulmonary nodule or mass noted. Pulmonary vasculature and the cardiomediastinal silhouette are within normal limits. IMPRESSION: No radiographic evidence of acute cardiopulmonary disease. Electronically Signed   By: Vinnie Langton M.D.   On: 11/14/2020 05:51   CT Angio Chest PE W and/or Wo Contrast  Result Date: 11/14/2020 CLINICAL DATA:  25 year old female with sharp midline chest pain for the past 2 days with radiation to the back and right wrist. Positive D-dimer. Evaluate for pulmonary embolism. EXAM: CT ANGIOGRAPHY CHEST WITH CONTRAST TECHNIQUE: Multidetector CT imaging of the chest was performed using the standard protocol during bolus administration of intravenous contrast. Multiplanar CT image reconstructions and MIPs were obtained to evaluate the vascular anatomy. CONTRAST:  73m OMNIPAQUE IOHEXOL 350 MG/ML  SOLN COMPARISON:  No priors. FINDINGS: Cardiovascular: There are no filling defects within the pulmonary arterial tree to suggest underlying pulmonary embolism. Heart size is normal. There is no significant pericardial fluid, thickening or pericardial calcification. No atherosclerotic calcifications are noted in the thoracic aorta or the coronary arteries. Mediastinum/Nodes: No pathologically enlarged mediastinal or hilar lymph nodes. Esophagus is unremarkable in appearance. No axillary lymphadenopathy. Lungs/Pleura: Minimal subsegmental atelectasis noted in the left lower lobe. No acute consolidative airspace disease. No pleural effusions. No suspicious appearing pulmonary nodules or masses are noted. Upper Abdomen: In segment 4A of the liver (axial image 72 of series 4 and coronal image 84 of series 7) there is a 3.5 x 2.5 x 2.4 cm heterogeneously enhancing lesion which is incompletely characterized on today's arterial phase examination. Musculoskeletal: There are no aggressive appearing lytic or blastic lesions noted in the visualized portions of the skeleton. Review of the MIP images confirms the above findings. IMPRESSION: 1. No evidence of pulmonary embolism. 2. No acute findings to account for the patient's symptoms. 3. Large heterogeneously enhancing lesion in segment 4A of the liver, incompletely characterized on today's arterial phase examination. Further characterization with nonemergent abdominal MRI with and without IV gadolinium (preferably Eovist) is recommended in the near future. Electronically Signed   By: DVinnie LangtonM.D.   On: 11/14/2020 06:43   DG Chest Port 1 View  Result Date: 11/15/2020 CLINICAL DATA:  Sepsis. EXAM: PORTABLE CHEST 1 VIEW COMPARISON:  Chest radiograph dated 11/14/2020 and CT dated 11/14/2020 FINDINGS: The heart size and mediastinal contours are within normal limits. Both lungs are clear. The visualized skeletal structures are unremarkable. IMPRESSION: No active  disease. Electronically Signed   By: AAnner CreteM.D.   On: 11/15/2020 01:34   ECHOCARDIOGRAM COMPLETE  Result Date: 11/14/2020    ECHOCARDIOGRAM REPORT   Patient Name:   KQIARACAMPBELL-MANNING Date of Exam: 11/14/2020 Medical Rec #:  [id number]              Height:       65.0 in Accession #:    [id number]             Weight:       153.0 lb Date of Birth:  611/28/1997               BSA:          1.765 m Patient Age:    25 years                BP:  100/69 mmHg Patient Gender: F                       HR:           116 bpm. Exam Location:  Inpatient Procedure: 2D Echo, 3D Echo, Cardiac Doppler and Color Doppler STAT ECHO Indications:    Elevated Troponin  History:        Patient has no prior history of Echocardiogram examinations.                 Arrythmias:Tachycardia; Signs/Symptoms:Chest Pain. Ulcerative                 collitis. H/O Covid 19. Suspected myocarditis/pericarditis.                 Chest pain for two days.  Sonographer:    Merrie Roof RDCS Referring Phys: [id number] [place]  1. Left ventricular ejection fraction, by estimation, is 60 to 65%. Left ventricular ejection fraction by 3D volume is 57 %. The left ventricle has normal function. The left ventricle has no regional wall motion abnormalities. Left ventricular diastolic  parameters were normal.  2. Right ventricular systolic function is normal. The right ventricular size is normal.  3. The mitral valve is normal in structure. No evidence of mitral valve regurgitation. No evidence of mitral stenosis.  4. The aortic valve is normal in structure. Aortic valve regurgitation is not visualized. No aortic stenosis is present.  5. The inferior vena cava is normal in size with greater than 50% respiratory variability, suggesting right atrial pressure of 3 mmHg. FINDINGS  Left Ventricle: Left ventricular ejection fraction, by estimation, is 60 to 65%. Left ventricular ejection fraction by 3D volume is 57 %. The left ventricle  has normal function. The left ventricle has no regional wall motion abnormalities. The left ventricular internal cavity size was normal in size. There is no left ventricular hypertrophy. Left ventricular diastolic parameters were normal. Normal left ventricular filling pressure. Right Ventricle: The right ventricular size is normal. No increase in right ventricular wall thickness. Right ventricular systolic function is normal. Left Atrium: Left atrial size was normal in size. Right Atrium: Right atrial size was normal in size. Pericardium: There is no evidence of pericardial effusion. Mitral Valve: The mitral valve is normal in structure. No evidence of mitral valve regurgitation. No evidence of mitral valve stenosis. Tricuspid Valve: The tricuspid valve is normal in structure. Tricuspid valve regurgitation is not demonstrated. No evidence of tricuspid stenosis. Aortic Valve: The aortic valve is normal in structure. Aortic valve regurgitation is not visualized. No aortic stenosis is present. Aortic valve mean gradient measures 5.0 mmHg. Aortic valve peak gradient measures 8.8 mmHg. Aortic valve area, by VTI measures 2.31 cm. Pulmonic Valve: The pulmonic valve was normal in structure. Pulmonic valve regurgitation is not visualized. No evidence of pulmonic stenosis. Aorta: The aortic root is normal in size and structure. Venous: The inferior vena cava is normal in size with greater than 50% respiratory variability, suggesting right atrial pressure of 3 mmHg. IAS/Shunts: No atrial level shunt detected by color flow Doppler.  LEFT VENTRICLE PLAX 2D LVIDd:         4.60 cm         Diastology LVIDs:         3.40 cm         LV e' medial:  12.30 cm/s LV PW:         0.90 cm  LV e' lateral: 12.90 cm/s LV IVS:        0.70 cm LVOT diam:     1.90 cm LV SV:         50              3D Volume EF LV SV Index:   28              LV 3D EF:    Left LVOT Area:     2.84 cm                     ventricul                                              ar                                             ejection                                             fraction                                             by 3D                                             volume is                                             57 %.                                 3D Volume EF:                                3D EF:        57 %                                LV EDV:       90 ml                                LV ESV:       39 ml                                LV SV:        51 ml RIGHT VENTRICLE RV Basal diam:  2.20 cm RV S prime:     12.10 cm/s TAPSE (M-mode):  1.6 cm LEFT ATRIUM             Index       RIGHT ATRIUM           Index LA diam:        3.00 cm 1.70 cm/m  RA Area:     15.70 cm LA Vol (A2C):   20.6 ml 11.67 ml/m RA Volume:   35.30 ml  20.00 ml/m LA Vol (A4C):   31.1 ml 17.62 ml/m LA Biplane Vol: 25.5 ml 14.45 ml/m  AORTIC VALVE AV Area (Vmax):    2.22 cm AV Area (Vmean):   2.26 cm AV Area (VTI):     2.31 cm AV Vmax:           148.00 cm/s AV Vmean:          104.000 cm/s AV VTI:            0.215 m AV Peak Grad:      8.8 mmHg AV Mean Grad:      5.0 mmHg LVOT Vmax:         116.00 cm/s LVOT Vmean:        83.000 cm/s LVOT VTI:          0.175 m LVOT/AV VTI ratio: 0.81  AORTA Ao Root diam: 2.40 cm Ao Asc diam:  1.90 cm  SHUNTS Systemic VTI:  0.18 m Systemic Diam: 1.90 cm Dani Gobble Croitoru MD Electronically signed by Sanda Klein MD Signature Date/Time: 11/14/2020/5:26:55 PM    Final      Patient Profile     25 y.o. female with history of Crohn's disease with troponin elevation, liver mass, fevers and hypotension in the setting of preserved LV function.  Assessment & Plan   Query of myocarditis Crohn's Disease Worsening microcytic anemia Question of adrenal insufficiency - troponin has peaked - no significant pericardial effusion or LV dysfunction - on colchicine; we have discussed the benefits of ibuprofen; notes that there could be a non-cardiac  portion of her pain related to join tenderness; discussed with primary that if they would like to switch ibuprofen to a different agent to assist with both types of pain, this is reasonable - if persistent cardiac pain and stable HGB, full dose ASA can be considered - CMR ordered; given her need for MRI-Abdomen for liver mass r/o pyogenic abscess, this will be pushed to 11/16/20; discussed with MRI team, patient and primary - tachycardia is sinus in nature and appears reactive to her illness, given BP, would not initiate BB at this time - discussed with primary:  steroids will may myo-pericarditis picture more difficult to treat, but given concerns for adrenal insufficiency this is reasonable to restart; all of patient's new symptoms seem to have occurred with steroid cessation.  Time Spent Directly with Patient:   I have spent a total of 35 minutes with the patient reviewing notes, imaging, EKGs, labs and examining the patient as well as establishing an assessment and plan that was discussed personally with the patient.  > 50% of time was spent in direct patient care.    For questions or updates, please contact [place] Please consult [email or web address] for contact info under Cardiology/STEMI.      Signed, Werner Lean, MD  11/15/2020, 7:35 AM

## 2020-11-15 NOTE — Consult Note (Signed)
Consultation  Referring Provider: [facility]/ [facility] Primary Care Physician:  Quentin Angst, MD Primary Gastroenterologist:  Dr.Le / [facility]  Reason for Consultation: History of ulcerative colitis, liver lesion, acute chest pain  HPI: Robin Fuller is a 25 y.o. female, who was admitted yesterday after she presented to the emergency room with acute right-sided chest pain which she said initially started about 2 days previous.  She relates sharp fairly constant pain in the upper part of her right chest that radiated some into her back.  She did not have any radiation into her abdomen.  No fever at home or chills, denies any shortness of breath or cough.  She did have COVID-19 in May 2022. She underwent CT angio of the chest which showed no evidence of pulmonary embolus, no adenopathy, no significant pericardial fluid thickening or pericardial calcification. She was noted to be somewhat hypotensive and tachycardic with heart rate of 130 initial temp 99 7 Chest x-ray negative CTA did show a 3.5 x 2.5 x 2.4 cm enhancing lesion in segment 4 of the liver.  Labs with significant troponin elevation initially 463 then 553 and 271. CRP 21.3/sed rate 32 D-dimer 1.88 LFTs normal with the exception of AST of 66  WBC 8.0/hemoglobin 9.9/hematocrit 32.4/MCV 75.3/platelets 635 Respiratory panel negative  Patient has been evaluated by cardiology, she has been placed on colchicine for possibility of myocarditis Preserved EF on echo and no pericardial effusion She is scheduled for MRI tomorrow  She underwent MRI of the liver today-result is pending  Patient was initially diagnosed with ulcerative colitis in 2018 and has been followed by Dr. Truman Hayward in [facility] intermittently though she says that she had not been seen there in the past couple of years as she had been "in remission".  She says she has had 2 previous colonoscopies, I cannot see the results of those, but apparently there had  been question as to whether she actually has ulcerative colitis or Crohn's.  She has not ever taken any medication other than prednisone. At the last office visit [facility] in December 2020 they wanted to initiate Lialda but she did not want to take any pills on a daily basis.  She was to schedule colonoscopy following that visit but did not pursue that. She says most of the time she feels fine but she periodically will have flareups which she thinks are either trigg  ered by something that she has eaten or stress.  When she has these flares she may have some abdominal pain for a couple of weeks and looser stools.  She has had a couple of ER visits this summer for those types of symptoms and has had 2 courses of tapered dose steroids.  She was on steroids over these past couple of weeks and says she was on 20 mg/day prior to coming in the hospital but had stopped it 2 days prior to that because she was having some aching in her body and wanted to attribute that to prednisone Today she says she is not having any abdominal pain, no diarrhea since admission and has not been having any diarrhea over the past month just looser stools intermittently. Reports that she does have a follow-up visit scheduled with [facility] in early September to be followed by colonoscopy.  Patient did have CT of the abdomen and pelvis done 10/07/2020 with an ER visit here.  There was no liver lesion noted on that scan.  She was noted  to have some mild haziness in the mesorectal fat and similar To a lesser degree haziness involving the colon to the transverse colon.  Remainder of the colon appeared normal and TI appeared normal.   Past Medical History:  Diagnosis Date   Crohn's disease ([place])    Frequent headaches    Migraine    Ulcerative colitis ([place])     History reviewed. No pertinent surgical history.  Prior to Admission medications   Medication Sig Start Date End Date Taking? Authorizing Provider  mesalamine  (LIALDA) 1.2 g EC tablet Take 2 tablets (2.4 g total) by mouth daily with breakfast. 10/31/20 12/30/20 Yes Godfrey Pick, MD  ondansetron (ZOFRAN ODT) 4 MG disintegrating tablet Take 1 tablet (4 mg total) by mouth every 8 (eight) hours as needed for up to 12 doses for nausea or vomiting. 10/31/20  Yes Godfrey Pick, MD  oxyCODONE-acetaminophen (PERCOCET) 5-325 MG tablet Take 1-2 tablets by mouth every 6 (six) hours as needed. 10/25/20  Yes Delo, Nathaneil Canary, MD  predniSONE (DELTASONE) 20 MG tablet 2 tabs po daily x 5 days, then 1 tabs x 10 days, then 1/2 tabs x 10 days Patient taking differently: Take 20 mg by mouth See admin instructions. 2 tabs po daily x 5 days, then 1 tabs x 10 days, then 1/2 tabs x 10 days 10/31/20  Yes Godfrey Pick, MD  ciprofloxacin (CIPRO) 500 MG tablet Take 1 tablet (500 mg total) by mouth 2 (two) times daily. Patient not taking: Reported on 11/14/2020 10/07/20   Mesner, Corene Cornea, MD  nirmatrelvir/ritonavir EUA (PAXLOVID) TABS Patient GFR is >90. Take nirmatrelvir (150 mg) 2 tablet(s) twice daily for 5 days and ritonavir (100 mg) one tablet twice daily for 5 days. Patient not taking: No sig reported 08/08/20   Horton, Barbette Hair, MD    Current Facility-Administered Medications  Medication Dose Route Frequency Provider Last Rate Last Admin   colchicine tablet 0.6 mg  0.6 mg Oral BID Chandrasekhar, Mahesh A, MD   0.6 mg at 11/15/20 1029   HYDROmorphone (DILAUDID) injection 0.5 mg  0.5 mg Intravenous Q4H PRN Orma Flaming, MD   0.5 mg at 11/15/20 1038   lactated ringers infusion   Intravenous Continuous Reubin Milan, MD 125 mL/hr at 11/15/20 0320 New Bag at 11/15/20 0320   mesalamine (LIALDA) EC tablet 2.4 g  2.4 g Oral Q breakfast Orma Flaming, MD   2.4 g at 11/15/20 0832   ondansetron (ZOFRAN-ODT) disintegrating tablet 4 mg  4 mg Oral Q8H PRN Reubin Milan, MD   4 mg at 11/15/20 6789   pantoprazole (PROTONIX) EC tablet 40 mg  40 mg Oral Q1200 Jonetta Osgood, MD   40 mg at  11/15/20 1029   predniSONE (DELTASONE) tablet 40 mg  40 mg Oral Q breakfast Jonetta Osgood, MD   40 mg at 11/15/20 1029    Allergies as of 11/14/2020 - Review Complete 11/14/2020  Allergen Reaction Noted   Hydrocodone-acetaminophen Nausea And Vomiting 10/25/2020   Morphine and related Itching 10/31/2020    History reviewed. No pertinent family history.  Social History   Socioeconomic History   Marital status: Single    Spouse name: Not on file   Number of children: Not on file   Years of education: Not on file   Highest education level: Not on file  Occupational History   Not on file  Tobacco Use   Smoking status: Never   Smokeless tobacco: Never  Vaping Use   Vaping Use:  Never used  Substance and Sexual Activity   Alcohol use: No   Drug use: No   Sexual activity: Yes    Birth control/protection: Injection  Other Topics Concern   Not on file  Social History Narrative   ** Merged History Encounter **       Social Determinants of Health   Financial Resource Strain: Not on file  Food Insecurity: Not on file  Transportation Needs: Not on file  Physical Activity: Not on file  Stress: Not on file  Social Connections: Not on file  Intimate Partner Violence: Not on file    Review of Systems: Pertinent positive and negative review of systems were noted in the above HPI section.  All other review of systems was otherwise negative.   Physical Exam: Vital signs in last 24 hours: Temp:  [98.5 F (36.9 C)-102.2 F (39 C)] 99.3 F (37.4 C) (08/24 1312) Pulse Rate:  [97-132] 97 (08/24 1312) Resp:  [18-21] 18 (08/24 1312) BP: (77-105)/(47-77) 94/58 (08/24 1312) SpO2:  [96 %-100 %] 100 % (08/24 1312) Weight:  [70.4 kg] 70.4 kg (08/24 0148) Last BM Date: 11/13/20 General:   Alert,  Well-developed, young African-American female well-nourished, pleasant and cooperative in NAD   Partner at bedside Head:  Normocephalic and atraumatic. Eyes:  Sclera clear, no icterus.    Conjunctiva pink. Ears:  Normal auditory acuity. Nose:  No deformity, discharge,  or lesions. Mouth:  No deformity or lesions.   Neck:  Supple; no masses or thyromegaly. Lungs:  Clear throughout to auscultation.   No wheezes, crackles, or rhonchi.  Heart: Tachycardic regular rate and rhythm; no murmurs, clicks, rubs,  or gallops. Abdomen:  Soft,nontender, BS active,nonpalp mass or hsm.   Rectal: Not done Msk:  Symmetrical without gross deformities. . Pulses:  Normal pulses noted. Extremities:  Without clubbing or edema. Neurologic:  Alert and  oriented x4;  grossly normal neurologically. Skin:  Intact without significant lesions or rashes.. Psych:  Alert and cooperative. Normal mood and affect.  Intake/Output from previous day: 08/23 0701 - 08/24 0700 In: 1480 [P.O.:480; IV Piggyback:1000] Out: -  Intake/Output this shift: Total I/O In: 120 [P.O.:120] Out: -   Lab Results: Recent Labs    11/14/20 0520 11/15/20 0122  WBC 8.0 6.4  HGB 9.9* 8.8*  HCT 32.4* 28.9*  PLT 633* 529*   BMET Recent Labs    11/14/20 0520 11/15/20 0122  NA 135 133*  K 3.5 3.7  CL 101 106  CO2 23 19*  GLUCOSE 148* 98  BUN 10 6  CREATININE 0.87 0.67  CALCIUM 8.8* 8.0*   LFT Recent Labs    11/14/20 0922 11/15/20 0122  PROT 6.4* 5.0*  ALBUMIN 2.9* 2.1*  AST 66* 16  ALT 27 20  ALKPHOS 66 49  BILITOT 1.8* 0.5  BILIDIR 0.8*  --   IBILI 1.0*  --    PT/INR Recent Labs    11/15/20 0136  LABPROT 14.3  INR 1.1   Hepatitis Panel No results for input(s): HEPBSAG, HCVAB, HEPAIGM, HEPBIGM in the last 72 hours.   IMPRESSION:  #32 25 year old African-American female with prior diagnosis of ulcerative colitis made about 4 years ago She has been followed by [facility] Dr. Marin Comment but had not been seen over the past couple of years. Patient has never been on any regular maintenance medication for colitis, has been treated periodically with prednisone and had declined admit with mesalamine in  the past as of 2020. Last colonoscopy was  done in 2019 or early 2020. Patient has been having some intermittent abdominal pain and looser stools this summer has had a couple of ER visits at which time she had been given short courses of steroids. She was on 20 mg of prednisone up to 2 days prior to this admission when she stopped it. Recent CT imaging done in July 2022 does suggest some activity of colitis in the rectum and left colon  #2 acute right-sided chest pain associated with tachycardia, mild hypotension and low-grade fever She has had markedly elevated troponins, elevated D-dimer sed rate and CRP CT angio negative for PE and no evidence of pericardial effusion Cardiology is following and cardiac MRI scheduled for tomorrow  Question myocarditis-has been started empirically on colchicine  #3 enhancing liver lesion on CTA-no evidence of liver lesion on contrasted CT of the abdomen pelvis done about 6 weeks ago Etiology not clear MRI done today, rule out small abscess  #4 macrocytic anemia/iron deficiency-likely secondary to chronic GI losses with underlying colitis  PLAN:  Await MRI liver results Await cardiac MRI tomorrow I do not think she is going to need endoscopic evaluation inpatient.  She is established with a gastroenterologist in [facility] and has a follow-up visit scheduled within the next couple of weeks.  She does need another colonoscopy and this was explained to her today and she was strongly encouraged to keep this follow-up appointment.  We also discussed importance of maintenance medications with inflammatory bowel disease rather than sporadic ER visits and short courses of prednisone which are ineffective to control disease  Start oral iron replacement She has been placed on prednisone 40 mg/day here, would taper back to 20 mg/day if this is being used purely from a colitis standpoint, and if no abscess on MRI of the liver and could be discharged home on low-dose  prednisone 20 mg/day x 2 weeks then 15 mg/day x 2 weeks then 10 mg/day x 2 weeks until she is seen by her outpatient gastroenterology    Annette Bertelson [place] PA-C 11/15/2020, 3:14 PM

## 2020-11-15 NOTE — Progress Notes (Signed)
PROGRESS NOTE        PATIENT DETAILS Name: Robin Fuller Age: 25 y.o. Sex: female Date of Birth: Dec 29, 1995 Admit Date: 11/14/2020 Admitting Physician Karmen Bongo, MD IOM:BTDHRC, Caffie Damme, MD  Brief Narrative: Patient is a 25 y.o. female UC/Crohn's-diagnosed 2016-subsequently lost to follow-up-goes to the ED for periodic flares for which she is prescribed a prednisone-per patient-she has been on prednisone continuously since 7/16-and abruptly stopped taking it 2-3 days prior to this hospital stay.  She presented to the hospital with chest pain-however prior to developing chest pain-she had pain in her right first MCP joint, right shoulder.  Significant events: 8/23>> admit for eval of chest pain  Significant studies: 7/16>> CT abdomen/pelvis: Colitis involving rectum up to transverse colon.   8/23>> CTA chest: No PE-enhancing lesion seen in liver. 8/23>> Echo: EF 60-65%, no pericardial effusion. 8/23>> CRP: 21.3.  Antimicrobial therapy: None  Microbiology data: 8/23>> influenza/COVID PCR: Negative 8/24>> blood cultures: Pending  Procedures : None  Consults: Cardiology, GI.  DVT Prophylaxis : SCDs Start: 11/14/20 1549   Subjective: Has reproducible chest pain-around the upper sternal area.  She refused ibuprofen-as it does not agree with her inflammatory bowel disease.  Acknowledges that she has been on steroids continuously for approximately a month-chart reviewed with pharmacy-it appears that she has been on steroids since at least 7/16.  Claims that when she started having right wrist/right shoulder pain and started having myalgias-she stopped taking her steroids-this was 2-3 days or so prior to this hospitalization  Had fever and hypotension last night.   Assessment/Plan: Chest pain: Reproducible-EKG/echo not suggestive of pericarditis-however hard to explain elevated troponin levels.  Cardiology following-cardiac MRI  planned.  Refuses ibuprofen-remains on colchicine-cardiology will likely add aspirin if further studies are consistent with pericarditis.  Not sure if chest pain is related to chest wall arthritis/inflammation from IBD.  Fever/transient hypotension: Developed fever and hypotension last night-she has a enhancing liver lesion-of unclear etiology-but given fever-worrisome that this may be of infectious etiology.  Have ordered MRI liver- will await blood cultures.  Was briefly hypotensive last night-responded to IV fluids-but blood pressure still soft.  Looks like patient has been on steroids for close to 5 weeks (since 7/16)-and she abruptly discontinued around 2 days prior to this hospitalization.  Given concern for relative adrenal insufficiency-after discussion with both GI/cardiology-have started her on some stress dose steroids.  Given relative clinical stability-reasonable to monitor off antimicrobial therapy for now-until culture data/MRI liver results are back.  Inflammatory bowel disease: Unclear whether this is a overlap between UC/Crohn's disease-reviewed prior GI notes from [facility].  She unfortunately has been lost to follow-up.  She has no abdominal pain-but has 2-3 bowel movements a day which are at her baseline.  Concerned that chest pain/right shoulder pain/liver issues could represent extra GI manifestations.  Have consulted GI-we will await further recommendations.  Microcytic anemia: Consistent with iron deficiency-unclear whether this is related to GI loss from colitis or she has menorrhagia.  Plan is to follow CBC.  Diet: Diet Order             Diet regular Room service appropriate? Yes; Fluid consistency: Thin  Diet effective now                    Code Status: Full code   Family Communication: None at  bedside  Disposition Plan: Status is: Inpatient  Remains inpatient appropriate because:Inpatient level of care appropriate due to severity of  illness  Dispo: The patient is from: Home              Anticipated d/c is to: Home              Patient currently is not medically stable to d/c.   Difficult to place patient No   Barriers to Discharge: Ongoing chest pain-fever-possible liver lesion-needs further work-up inpatient for safe discharge.  See above note.  Antimicrobial agents: Anti-infectives (From admission, onward)    None        Time spent: 35 minutes-Greater than 50% of this time was spent in counseling, explanation of diagnosis, planning of further management, and coordination of care.  MEDICATIONS: Scheduled Meds:  colchicine  0.6 mg Oral BID   ibuprofen  600 mg Oral TID   mesalamine  2.4 g Oral Q breakfast   pantoprazole  40 mg Oral Q1200   predniSONE  40 mg Oral Q breakfast   Continuous Infusions:  lactated ringers 125 mL/hr at 11/15/20 0320   PRN Meds:.HYDROmorphone (DILAUDID) injection, ondansetron   PHYSICAL EXAM: Vital signs: Vitals:   11/15/20 0202 11/15/20 0249 11/15/20 0403 11/15/20 0814  BP: 96/69 99/62 105/60 (!) 93/54  Pulse: (!) 115 (!) 127 (!) 127 (!) 124  Resp: 20 20 18 20   Temp: 98.5 F (36.9 C) 98.6 F (37 C) 98.7 F (37.1 C) 99.9 F (37.7 C)  TempSrc: Oral Oral Oral Oral  SpO2: 100% 100% 100% 100%  Weight:      Height:       Filed Weights   11/14/20 0450 11/15/20 0148  Weight: 69.4 kg 70.4 kg   Body mass index is 25.83 kg/m.   Gen Exam:Alert awake-not in any distress HEENT:atraumatic, normocephalic Chest: B/L clear to auscultation anteriorly CVS:S1S2 regular Abdomen:soft non tender, non distended Extremities:no edema Neurology: Non focal Skin: no rash  I have personally reviewed following labs and imaging studies  LABORATORY DATA: CBC: Recent Labs  Lab 11/14/20 0520 11/15/20 0122  WBC 8.0 6.4  NEUTROABS 4.7 3.7  HGB 9.9* 8.8*  HCT 32.4* 28.9*  MCV 75.3* 75.3*  PLT 633* 529*    Basic Metabolic Panel: Recent Labs  Lab 11/14/20 0520  11/15/20 0122  NA 135 133*  K 3.5 3.7  CL 101 106  CO2 23 19*  GLUCOSE 148* 98  BUN 10 6  CREATININE 0.87 0.67  CALCIUM 8.8* 8.0*    GFR: Estimated Creatinine Clearance: 106.8 mL/min (by C-G formula based on SCr of 0.67 mg/dL).  Liver Function Tests: Recent Labs  Lab 11/14/20 0922 11/15/20 0122  AST 66* 16  ALT 27 20  ALKPHOS 66 49  BILITOT 1.8* 0.5  PROT 6.4* 5.0*  ALBUMIN 2.9* 2.1*   No results for input(s): LIPASE, AMYLASE in the last 168 hours. No results for input(s): AMMONIA in the last 168 hours.  Coagulation Profile: Recent Labs  Lab 11/15/20 0136  INR 1.1    Cardiac Enzymes: No results for input(s): CKTOTAL, CKMB, CKMBINDEX, TROPONINI in the last 168 hours.  BNP (last 3 results) No results for input(s): PROBNP in the last 8760 hours.  Lipid Profile: No results for input(s): CHOL, HDL, LDLCALC, TRIG, CHOLHDL, LDLDIRECT in the last 72 hours.  Thyroid Function Tests: Recent Labs    11/14/20 1632  TSH 0.644    Anemia Panel: Recent Labs    11/14/20 1632  FERRITIN 48  TIBC 270  IRON 9*    Urine analysis:    Component Value Date/Time   COLORURINE YELLOW 11/15/2020 1035   APPEARANCEUR CLEAR 11/15/2020 1035   LABSPEC 1.008 11/15/2020 1035   PHURINE 7.0 11/15/2020 1035   GLUCOSEU >=500 (A) 11/15/2020 1035   HGBUR MODERATE (A) 11/15/2020 1035   BILIRUBINUR NEGATIVE 11/15/2020 1035   KETONESUR 5 (A) 11/15/2020 1035   PROTEINUR NEGATIVE 11/15/2020 1035   UROBILINOGEN 1.0 07/06/2014 2148   NITRITE NEGATIVE 11/15/2020 1035   LEUKOCYTESUR NEGATIVE 11/15/2020 1035    Sepsis Labs: Lactic Acid, Venous    Component Value Date/Time   LATICACIDVEN 1.3 11/15/2020 0137    MICROBIOLOGY: Recent Results (from the past 240 hour(s))  Resp Panel by RT-PCR (Flu A&B, Covid) Nasopharyngeal Swab     Status: None   Collection Time: 11/14/20  6:37 AM   Specimen: Nasopharyngeal Swab; Nasopharyngeal(NP) swabs in vial transport medium  Result Value Ref  Range Status   SARS Coronavirus 2 by RT PCR NEGATIVE NEGATIVE Final    Comment: (NOTE) SARS-CoV-2 target nucleic acids are NOT DETECTED.  The SARS-CoV-2 RNA is generally detectable in upper respiratory specimens during the acute phase of infection. The lowest concentration of SARS-CoV-2 viral copies this assay can detect is 138 copies/mL. A negative result does not preclude SARS-Cov-2 infection and should not be used as the sole basis for treatment or other patient management decisions. A negative result may occur with  improper specimen collection/handling, submission of specimen other than nasopharyngeal swab, presence of viral mutation(s) within the areas targeted by this assay, and inadequate number of viral copies(<138 copies/mL). A negative result must be combined with clinical observations, patient history, and epidemiological information. The expected result is Negative.  Fact Sheet for Patients:  EntrepreneurPulse.com.au  Fact Sheet for Healthcare Providers:  IncredibleEmployment.be  This test is no t yet approved or cleared by the [place] FDA and  has been authorized for detection and/or diagnosis of SARS-CoV-2 by FDA under an Emergency Use Authorization (EUA). This EUA will remain  in effect (meaning this test can be used) for the duration of the COVID-19 declaration under Section 564(b)(1) of the Act, 21 U.S.C.section 360bbb-3(b)(1), unless the authorization is terminated  or revoked sooner.       Influenza A by PCR NEGATIVE NEGATIVE Final   Influenza B by PCR NEGATIVE NEGATIVE Final    Comment: (NOTE) The Xpert Xpress SARS-CoV-2/FLU/RSV plus assay is intended as an aid in the diagnosis of influenza from Nasopharyngeal swab specimens and should not be used as a sole basis for treatment. Nasal washings and aspirates are unacceptable for Xpert Xpress SARS-CoV-2/FLU/RSV testing.  Fact Sheet for  Patients: EntrepreneurPulse.com.au  Fact Sheet for Healthcare Providers: IncredibleEmployment.be  This test is not yet approved or cleared by the [place] FDA and has been authorized for detection and/or diagnosis of SARS-CoV-2 by FDA under an Emergency Use Authorization (EUA). This EUA will remain in effect (meaning this test can be used) for the duration of the COVID-19 declaration under Section 564(b)(1) of the Act, 21 U.S.C. section 360bbb-3(b)(1), unless the authorization is terminated or revoked.  Performed at [facility], [address] [place], [place] [address]     RADIOLOGY STUDIES/RESULTS: DG Chest 2 View  Result Date: 11/14/2020 CLINICAL DATA:  25 year old female with history of chest pain. Pain radiating into the back and down into the right wrist. Pain worsens with movement. EXAM: CHEST - 2 VIEW COMPARISON:  Chest x-ray 03/06/2016. FINDINGS: Lung  volumes are normal. No consolidative airspace disease. No pleural effusions. No pneumothorax. No pulmonary nodule or mass noted. Pulmonary vasculature and the cardiomediastinal silhouette are within normal limits. IMPRESSION: No radiographic evidence of acute cardiopulmonary disease. Electronically Signed   By: Vinnie Langton M.D.   On: 11/14/2020 05:51   CT Angio Chest PE W and/or Wo Contrast  Result Date: 11/14/2020 CLINICAL DATA:  25 year old female with sharp midline chest pain for the past 2 days with radiation to the back and right wrist. Positive D-dimer. Evaluate for pulmonary embolism. EXAM: CT ANGIOGRAPHY CHEST WITH CONTRAST TECHNIQUE: Multidetector CT imaging of the chest was performed using the standard protocol during bolus administration of intravenous contrast. Multiplanar CT image reconstructions and MIPs were obtained to evaluate the vascular anatomy. CONTRAST:  75m OMNIPAQUE IOHEXOL 350 MG/ML SOLN COMPARISON:  No priors. FINDINGS: Cardiovascular: There are no  filling defects within the pulmonary arterial tree to suggest underlying pulmonary embolism. Heart size is normal. There is no significant pericardial fluid, thickening or pericardial calcification. No atherosclerotic calcifications are noted in the thoracic aorta or the coronary arteries. Mediastinum/Nodes: No pathologically enlarged mediastinal or hilar lymph nodes. Esophagus is unremarkable in appearance. No axillary lymphadenopathy. Lungs/Pleura: Minimal subsegmental atelectasis noted in the left lower lobe. No acute consolidative airspace disease. No pleural effusions. No suspicious appearing pulmonary nodules or masses are noted. Upper Abdomen: In segment 4A of the liver (axial image 72 of series 4 and coronal image 84 of series 7) there is a 3.5 x 2.5 x 2.4 cm heterogeneously enhancing lesion which is incompletely characterized on today's arterial phase examination. Musculoskeletal: There are no aggressive appearing lytic or blastic lesions noted in the visualized portions of the skeleton. Review of the MIP images confirms the above findings. IMPRESSION: 1. No evidence of pulmonary embolism. 2. No acute findings to account for the patient's symptoms. 3. Large heterogeneously enhancing lesion in segment 4A of the liver, incompletely characterized on today's arterial phase examination. Further characterization with nonemergent abdominal MRI with and without IV gadolinium (preferably Eovist) is recommended in the near future. Electronically Signed   By: DVinnie LangtonM.D.   On: 11/14/2020 06:43   DG Chest Port 1 View  Result Date: 11/15/2020 CLINICAL DATA:  Sepsis. EXAM: PORTABLE CHEST 1 VIEW COMPARISON:  Chest radiograph dated 11/14/2020 and CT dated 11/14/2020 FINDINGS: The heart size and mediastinal contours are within normal limits. Both lungs are clear. The visualized skeletal structures are unremarkable. IMPRESSION: No active disease. Electronically Signed   By: AAnner CreteM.D.   On:  11/15/2020 01:34   ECHOCARDIOGRAM COMPLETE  Result Date: 11/14/2020    ECHOCARDIOGRAM REPORT   Patient Name:   Robin Fuller Date of Exam: 11/14/2020 Medical Rec #:  [id number]              Height:       65.0 in Accession #:    [id number]             Weight:       153.0 lb Date of Birth:  [birth year]               BSA:          1.765 m Patient Age:    25 years                BP:           100/69 mmHg Patient Gender: F  HR:           116 bpm. Exam Location:  Inpatient Procedure: 2D Echo, 3D Echo, Cardiac Doppler and Color Doppler STAT ECHO Indications:    Elevated Troponin  History:        Patient has no prior history of Echocardiogram examinations.                 Arrythmias:Tachycardia; Signs/Symptoms:Chest Pain. Ulcerative                 collitis. H/O Covid 19. Suspected myocarditis/pericarditis.                 Chest pain for two days.  Sonographer:    Merrie Roof RDCS Referring Phys: [id number] [place]  1. Left ventricular ejection fraction, by estimation, is 60 to 65%. Left ventricular ejection fraction by 3D volume is 57 %. The left ventricle has normal function. The left ventricle has no regional wall motion abnormalities. Left ventricular diastolic  parameters were normal.  2. Right ventricular systolic function is normal. The right ventricular size is normal.  3. The mitral valve is normal in structure. No evidence of mitral valve regurgitation. No evidence of mitral stenosis.  4. The aortic valve is normal in structure. Aortic valve regurgitation is not visualized. No aortic stenosis is present.  5. The inferior vena cava is normal in size with greater than 50% respiratory variability, suggesting right atrial pressure of 3 mmHg. FINDINGS  Left Ventricle: Left ventricular ejection fraction, by estimation, is 60 to 65%. Left ventricular ejection fraction by 3D volume is 57 %. The left ventricle has normal function. The left ventricle has no regional wall  motion abnormalities. The left ventricular internal cavity size was normal in size. There is no left ventricular hypertrophy. Left ventricular diastolic parameters were normal. Normal left ventricular filling pressure. Right Ventricle: The right ventricular size is normal. No increase in right ventricular wall thickness. Right ventricular systolic function is normal. Left Atrium: Left atrial size was normal in size. Right Atrium: Right atrial size was normal in size. Pericardium: There is no evidence of pericardial effusion. Mitral Valve: The mitral valve is normal in structure. No evidence of mitral valve regurgitation. No evidence of mitral valve stenosis. Tricuspid Valve: The tricuspid valve is normal in structure. Tricuspid valve regurgitation is not demonstrated. No evidence of tricuspid stenosis. Aortic Valve: The aortic valve is normal in structure. Aortic valve regurgitation is not visualized. No aortic stenosis is present. Aortic valve mean gradient measures 5.0 mmHg. Aortic valve peak gradient measures 8.8 mmHg. Aortic valve area, by VTI measures 2.31 cm. Pulmonic Valve: The pulmonic valve was normal in structure. Pulmonic valve regurgitation is not visualized. No evidence of pulmonic stenosis. Aorta: The aortic root is normal in size and structure. Venous: The inferior vena cava is normal in size with greater than 50% respiratory variability, suggesting right atrial pressure of 3 mmHg. IAS/Shunts: No atrial level shunt detected by color flow Doppler.  LEFT VENTRICLE PLAX 2D LVIDd:         4.60 cm         Diastology LVIDs:         3.40 cm         LV e' medial:  12.30 cm/s LV PW:         0.90 cm         LV e' lateral: 12.90 cm/s LV IVS:        0.70 cm LVOT diam:  1.90 cm LV SV:         50              3D Volume EF LV SV Index:   28              LV 3D EF:    Left LVOT Area:     2.84 cm                     ventricul                                             ar                                              ejection                                             fraction                                             by 3D                                             volume is                                             57 %.                                 3D Volume EF:                                3D EF:        57 %                                LV EDV:       90 ml                                LV ESV:       39 ml                                LV SV:        51 ml RIGHT VENTRICLE RV Basal diam:  2.20 cm RV S prime:     12.10 cm/s TAPSE (M-mode): 1.6 cm LEFT ATRIUM             Index  RIGHT ATRIUM           Index LA diam:        3.00 cm 1.70 cm/m  RA Area:     15.70 cm LA Vol (A2C):   20.6 ml 11.67 ml/m RA Volume:   35.30 ml  20.00 ml/m LA Vol (A4C):   31.1 ml 17.62 ml/m LA Biplane Vol: 25.5 ml 14.45 ml/m  AORTIC VALVE AV Area (Vmax):    2.22 cm AV Area (Vmean):   2.26 cm AV Area (VTI):     2.31 cm AV Vmax:           148.00 cm/s AV Vmean:          104.000 cm/s AV VTI:            0.215 m AV Peak Grad:      8.8 mmHg AV Mean Grad:      5.0 mmHg LVOT Vmax:         116.00 cm/s LVOT Vmean:        83.000 cm/s LVOT VTI:          0.175 m LVOT/AV VTI ratio: 0.81  AORTA Ao Root diam: 2.40 cm Ao Asc diam:  1.90 cm  SHUNTS Systemic VTI:  0.18 m Systemic Diam: 1.90 cm Dani Gobble Croitoru MD Electronically signed by Sanda Klein MD Signature Date/Time: 11/14/2020/5:26:55 PM    Final      LOS: 1 day   Oren Binet, MD  [facility]    To contact the attending provider between 7A-7P or the covering provider during after hours 7P-7A, please log into the web site [email or web address] and access using universal [place] password for that web site. If you do not have the password, please call the hospital operator.  11/15/2020, 12:37 PM

## 2020-11-15 NOTE — Progress Notes (Signed)
   11/15/20 0107  Assess: MEWS Score  Temp 99.2 F (37.3 C)  BP (!) 77/47  Pulse Rate (!) 129  Resp (!) 21  SpO2 100 %  O2 Device Room Air  Assess: MEWS Score  MEWS Temp 0  MEWS Systolic 2  MEWS Pulse 2  MEWS RR 1  MEWS LOC 0  MEWS Score 5  MEWS Score Color Red   MD and charge notified. New orders for labs, bolus, toradol, and cooling measures. MEWS protocol implemented

## 2020-11-15 NOTE — Progress Notes (Signed)
[facility] night shift telemetry coverage note.  The patient had a fever of 102.2 F with tachycardia in the 130s and hypotension was 77/47 mmHg.  She responded to fluid boluses and Toradol 15 mg IVP.  She had normal lactic acid and procalcitonin level.  Her checks x-ray was clear.  Blood cultures were drawn and are pending.  We still need to obtain a urinalysis.  Maintenance fluids were added in the form of LR at 125 mL/h for 16 hours.  Tennis Must, MD.

## 2020-11-15 NOTE — Progress Notes (Signed)
[facility] night shift telemetry coverage note.  The staff reports that the patient is having a fever of 102.2 F.  She is tachycardic at 132 bpm.  The rest of the vital signs are within normal limits.  She was admitted due to chest pain with precedent of pericarditis/myocarditis and has elevated sed rate and CRP.  She told the staff that she is allergic to acetaminophen and does not want to take ibuprofen as well.  She tolerated Toradol 15 mg IVP in the ER earlier today.  Will give another 15 mg of ketorolac IVP x1, LR 1000 mL IV bolus, obtain blood cultures x2 and although I suspect this is noninfectious, I will check procalcitonin level.  Tennis Must, MD.

## 2020-11-16 ENCOUNTER — Inpatient Hospital Stay (HOSPITAL_COMMUNITY): Payer: Medicaid Other

## 2020-11-16 DIAGNOSIS — R932 Abnormal findings on diagnostic imaging of liver and biliary tract: Secondary | ICD-10-CM

## 2020-11-16 DIAGNOSIS — R079 Chest pain, unspecified: Secondary | ICD-10-CM | POA: Diagnosis not present

## 2020-11-16 DIAGNOSIS — I514 Myocarditis, unspecified: Secondary | ICD-10-CM

## 2020-11-16 LAB — COMPREHENSIVE METABOLIC PANEL
ALT: 31 U/L (ref 0–44)
AST: 26 U/L (ref 15–41)
Albumin: 2.1 g/dL — ABNORMAL LOW (ref 3.5–5.0)
Alkaline Phosphatase: 55 U/L (ref 38–126)
Anion gap: 7 (ref 5–15)
BUN: <5 mg/dL — ABNORMAL LOW (ref 6–20)
CO2: 22 mmol/L (ref 22–32)
Calcium: 8.3 mg/dL — ABNORMAL LOW (ref 8.9–10.3)
Chloride: 107 mmol/L (ref 98–111)
Creatinine, Ser: 0.61 mg/dL (ref 0.44–1.00)
GFR, Estimated: >60 mL/min (ref >60)
Glucose, Bld: 137 mg/dL — ABNORMAL HIGH (ref 70–99)
Potassium: 3.9 mmol/L (ref 3.5–5.1)
Sodium: 136 mmol/L (ref 135–145)
Total Bilirubin: 0.5 mg/dL (ref 0.3–1.2)
Total Protein: 5.4 g/dL — ABNORMAL LOW (ref 6.5–8.1)

## 2020-11-16 LAB — CBC
HCT: 29.4 % — ABNORMAL LOW (ref 36.0–46.0)
Hemoglobin: 8.8 g/dL — ABNORMAL LOW (ref 12.0–15.0)
MCH: 22.7 pg — ABNORMAL LOW (ref 26.0–34.0)
MCHC: 29.9 g/dL — ABNORMAL LOW (ref 30.0–36.0)
MCV: 75.8 fL — ABNORMAL LOW (ref 80.0–100.0)
Platelets: 587 10*3/uL — ABNORMAL HIGH (ref 150–400)
RBC: 3.88 MIL/uL (ref 3.87–5.11)
RDW: 17.2 % — ABNORMAL HIGH (ref 11.5–15.5)
WBC: 5.4 10*3/uL (ref 4.0–10.5)
nRBC: 0 % (ref 0.0–0.2)

## 2020-11-16 LAB — PROCALCITONIN: Procalcitonin: <0.1 ng/mL

## 2020-11-16 LAB — HIGH SENSITIVITY CRP: CRP, High Sensitivity: 239.86 mg/L — ABNORMAL HIGH (ref 0.00–3.00)

## 2020-11-16 IMAGING — MR MR CARD MORPHOLOGY WO/W CM
45 of 48 series · 45 of 48 positions shown · IV contrast (Contrast agent)
Comparison: none

CLINICAL DATA: Clinical question of myocarditis
Study assumes HCT of 29 and BSA of

EXAM:
CARDIAC MRI
TECHNIQUE: The patient was scanned on a 1.5 Tesla GE magnet. A dedicated
cardiac coil was used. Functional imaging was done using [employer]iest[employer]
sequences. [DATE], and 4 chamber views were done to assess for RWMA's.
Modified RANTONA rule using a short axis stack was used to
calculate an ejection fraction on a dedicated work station using
[employer] software. The patient received 7 cc of Gadavist. After 10
minutes inversion recovery sequences were used to assess for
infiltration and scar tissue.
CONTRAST:  7 cc  of Gadavist

[Series 4: [id number] · axial · 8.0mm · 1.41mm/px · 1 of 16 slices shown]
[im 1/16]
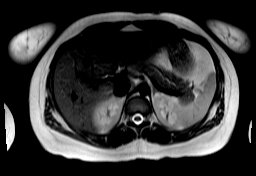

[Series 8: bSSFP · oblique · 8.0mm · 1.61mm/px · 1 of 25 slices shown (1 of 19)]
[im 1/25]
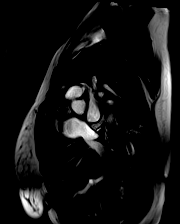

[Series 9: bSSFP · oblique · 8.0mm · 1.61mm/px · 1 of 25 slices shown (2 of 19)]
[im 1/25]
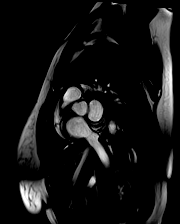

[Series 10: bSSFP · oblique · 8.0mm · 1.61mm/px · 1 of 25 slices shown (3 of 19)]
[im 1/25]
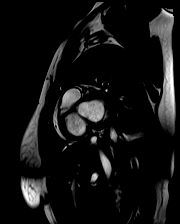

[Series 11: bSSFP · oblique · 8.0mm · 1.61mm/px · 1 of 25 slices shown (4 of 19)]
[im 1/25]
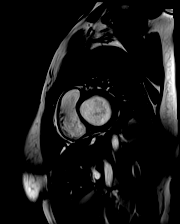

[Series 12: bSSFP · oblique · 8.0mm · 1.61mm/px · 1 of 25 slices shown (5 of 19)]
[im 1/25]
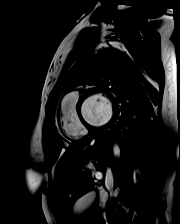

[Series 13: bSSFP · oblique · 8.0mm · 1.61mm/px · 1 of 25 slices shown (6 of 19)]
[im 1/25]
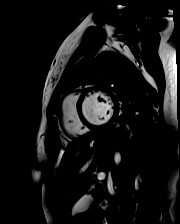

[Series 14: bSSFP · oblique · 8.0mm · 1.61mm/px · 1 of 25 slices shown (7 of 19)]
[im 1/25]
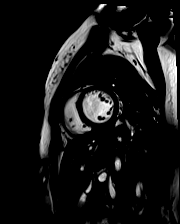

[Series 15: bSSFP · oblique · 8.0mm · 1.61mm/px · 1 of 25 slices shown (8 of 19)]
[im 1/25]
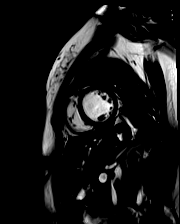

[Series 16: bSSFP · oblique · 8.0mm · 1.61mm/px · 1 of 25 slices shown (9 of 19)]
[im 1/25]
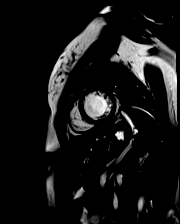

[Series 17: bSSFP · oblique · 8.0mm · 1.61mm/px · 1 of 25 slices shown (10 of 19)]
[im 1/25]
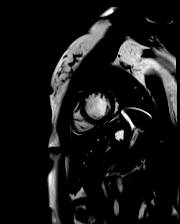

[Series 18: bSSFP · oblique · 8.0mm · 1.61mm/px · 1 of 25 slices shown (11 of 19)]
[im 1/25]
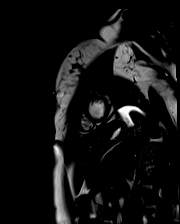

[Series 19: bSSFP · oblique · 8.0mm · 1.61mm/px · 1 of 25 slices shown (12 of 19)]
[im 1/25]
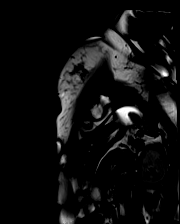

[Series 20: bSSFP · oblique · 8.0mm · 1.61mm/px · 1 of 25 slices shown (13 of 19)]
[im 1/25]
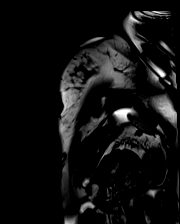

[Series 21: bSSFP · oblique · 8.0mm · 1.61mm/px · 1 of 25 slices shown (14 of 19)]
[im 1/25]
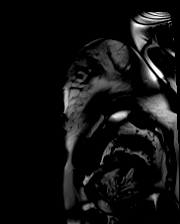

[Series 22: bSSFP · oblique · 8.0mm · 1.61mm/px · 1 of 25 slices shown (15 of 19)]
[im 1/25]
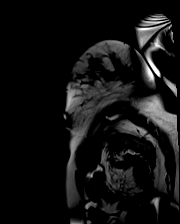

[Series 23: bSSFP · oblique · 6.0mm · 1.41mm/px · 1 of 25 slices shown (16 of 19)]
[im 1/25]
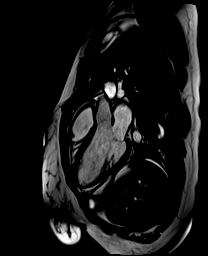

[Series 24: bSSFP · coronal · 6.0mm · 1.41mm/px · 1 of 25 slices shown (17 of 19)]
[im 1/25]
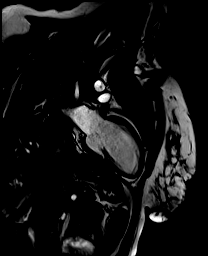

[Series 25: bSSFP · axial · 6.0mm · 1.41mm/px · 1 of 25 slices shown (18 of 19)]
[im 1/25]
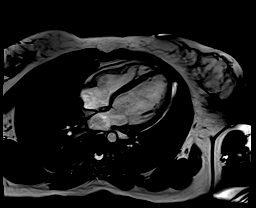

[Series 26: (id)_long_t1 · oblique · 8.0mm · 2.08mm/px · 1 of 24 slices shown]
[im 1/24]
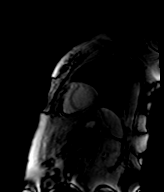

[Series 27: (id)_long_t1_moco · oblique · 8.0mm · 2.08mm/px · 1 of 24 slices shown]
[im 1/24]
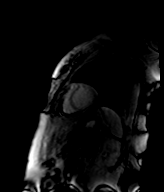

[Series 28: (id)_long_t1_moco_t1 · 1 of 3 slices shown (1 of 2)]
[im 1/3]
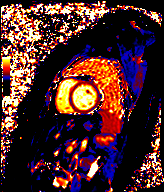

[Series 28: (id)_long_t1_moco_t1 · oblique · 8.0mm · 2.08mm/px · 1 of 3 slices shown (2 of 2)]
[im 1/3]
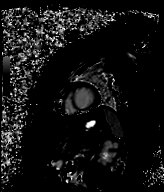

[Series 30: (id)_trufi · oblique · 8.0mm · 2.08mm/px · 1 of 9 slices shown]
[im 1/9]
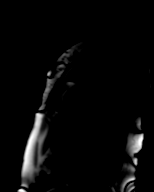

[Series 31: (id)_trufi_moco · oblique · 8.0mm · 2.08mm/px · 1 of 9 slices shown]
[im 1/9]
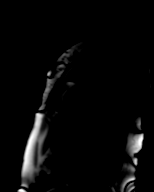

[Series 32: (id)_trufi_moco_t2 · oblique · 8.0mm · 2.08mm/px · 1 of 3 slices shown]
[im 1/3]
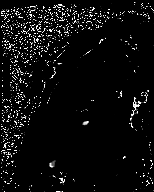

[Series 36: (id) 4 ch · axial · 6.0mm · 1.92mm/px · 1 of 1 slices shown]
[im 1/1]
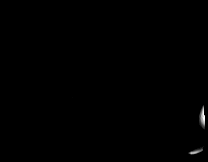

[Series 38: pre short axis · oblique · non-contrast · 8.0mm · 2.25mm/px · 1 of 10 slices shown (1 of 6)]
[im 1/10]
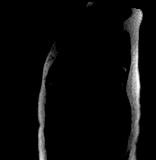

[Series 39: pre short axis · oblique · non-contrast · 8.0mm · 2.25mm/px · 1 of 10 slices shown (2 of 6)]
[im 1/10]
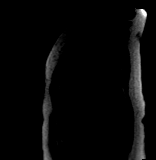

[Series 40: pre short axis · oblique · non-contrast · 8.0mm · 2.25mm/px · 1 of 10 slices shown (3 of 6)]
[im 1/10]
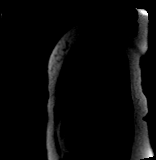

[Series 41: pre short axis · oblique · non-contrast · 8.0mm · 2.25mm/px · 1 of 10 slices shown (4 of 6)]
[im 1/10]
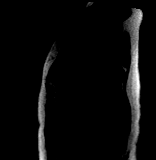

[Series 42: pre short axis · oblique · non-contrast · 8.0mm · 2.25mm/px · 1 of 10 slices shown (5 of 6)]
[im 1/10]
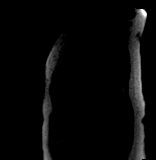

[Series 43: pre short axis · oblique · non-contrast · 8.0mm · 2.25mm/px · 1 of 10 slices shown (6 of 6)]
[im 1/10]
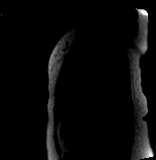

[Series 44: rest short axis · oblique · 8.0mm · 2.25mm/px · 1 of 60 slices shown (1 of 6)]
[im 1/60]
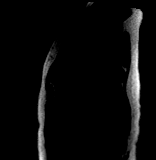

[Series 45: rest short axis · oblique · 8.0mm · 2.25mm/px · 1 of 60 slices shown (2 of 6)]
[im 1/60]
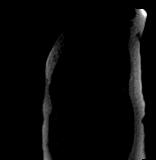

[Series 46: rest short axis · oblique · 8.0mm · 2.25mm/px · 1 of 60 slices shown (3 of 6)]
[im 1/60]
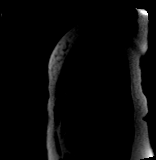

[Series 47: rest short axis · oblique · 8.0mm · 2.25mm/px · 1 of 60 slices shown (4 of 6)]
[im 1/60]
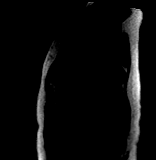

[Series 48: rest short axis · oblique · 8.0mm · 2.25mm/px · 1 of 60 slices shown (5 of 6)]
[im 1/60]
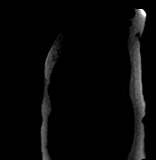

[Series 49: rest short axis · oblique · 8.0mm · 2.25mm/px · 1 of 60 slices shown (6 of 6)]
[im 1/60]
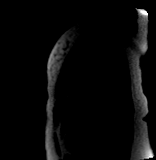

[Series 50: bSSFP · coronal · 6.0mm · 1.41mm/px · 1 of 25 slices shown (19 of 19)]
[im 1/25]
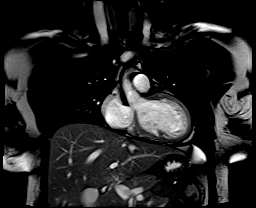

[Series 51: cine rvit · coronal · 6.0mm · 1.41mm/px · 1 of 25 slices shown]
[im 1/25]
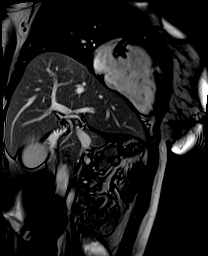

[Series 52: aortic valve cine · axial · 6.0mm · 1.41mm/px · 1 of 25 slices shown]
[im 1/25]
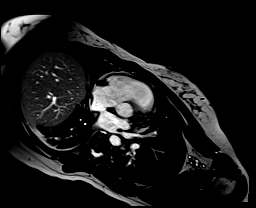

[Series 53: cine rvot · sagittal · 6.0mm · 1.41mm/px · 1 of 25 slices shown]
[im 1/25]
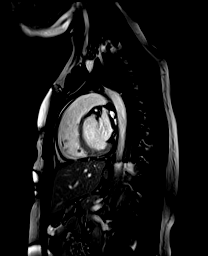

[Series 55: lge_single shot sa · oblique · 8.0mm · 2.08mm/px · 1 of 18 slices shown (1 of 2)]
[im 1/18]
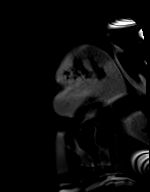

[Series 56: lge_single shot sa · oblique · 8.0mm · 2.08mm/px · 1 of 18 slices shown (2 of 2)]
[im 1/18]
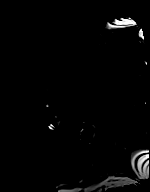

[45 of 48 positions shown; findings below may reference images not displayed]

FINDINGS: 1. Mild increase in left ventricular size, with LVEDD 51 mm, but
LVEDVi 84 mL/m2.

Normal left ventricular thickness, with intraventricular septal
thickness of 6 mm, posterior wall thickness of 5 mm, and a
myocardial mass index of 44 g/m2.

Mild left ventricular systolic dysfunction (LVEF =41%). There is
global hypokinesis.

Left ventricular parametric mapping notable for anteroseptal to
inferior elevation in ECV signal (41% inferior apex, 40% inferior
mid, 39% inferior base) and increase in T2 signal (anterior apex 59
ms, lateral apex 60 ms, inferoseptal mid 56 ms inferior base 59 ms).

There is no late gadolinium enhancement in the left ventricular
myocardium.

2. Normal right ventricular size with RVEDVI 58 mL/m2.

Normal right ventricular thickness.

Normal right ventricular systolic dysfunction (RVEF =50%). There are
no regional wall motion abnormalities or aneurysms.

3.  Normal left and right atrial size.

4. Normal size of the aortic root, ascending aorta and pulmonary
artery.

5.  No significant valvular abnormalities.

Mild prolapse of the anterior mitral leaflet noted.

6. No pericardial thickening on dark blood imaging. No significant
pericardial effusion. No pericardial enhancement.

7. Grossly, no extracardiac findings. Recommended dedicated study if
concerned for non-cardiac pathology.
IMPRESSION: Mild decrease in left ventricular function with mild increase in
indexed left ventricular size.

Modified RANTONA Criteria met for RANTONA diagnosis of myocarditis.

## 2020-11-16 MED ORDER — KETOROLAC TROMETHAMINE 30 MG/ML IJ SOLN
30.0000 mg | Freq: Three times a day (TID) | INTRAMUSCULAR | Status: DC | PRN
  Administered 2020-11-16 – 2020-11-17 (×3): 30 mg via INTRAVENOUS
  Filled 2020-11-16 (×4): qty 1

## 2020-11-16 MED ORDER — PREDNISONE 20 MG PO TABS
30.0000 mg | ORAL_TABLET | Freq: Every day | ORAL | Status: DC
  Administered 2020-11-17: 30 mg via ORAL
  Filled 2020-11-16: qty 1

## 2020-11-16 MED ORDER — HYDROMORPHONE HCL 1 MG/ML IJ SOLN
0.5000 mg | Freq: Once | INTRAMUSCULAR | Status: AC
Start: 2020-11-16 — End: 2020-11-16
  Administered 2020-11-16: 0.5 mg via INTRAVENOUS
  Filled 2020-11-16: qty 0.5

## 2020-11-16 MED ORDER — GADOBUTROL 1 MMOL/ML IV SOLN
7.0000 mL | Freq: Once | INTRAVENOUS | Status: AC | PRN
  Administered 2020-11-16: 7 mL via INTRAVENOUS

## 2020-11-16 NOTE — Progress Notes (Signed)
Pt c/o severe CP despite PRN Dilaudid being given 1.5 hours ago. Pt requesting extra dose. MD paged.

## 2020-11-16 NOTE — Progress Notes (Signed)
Progress Note  Patient Name: Robin Fuller Date of Encounter: 11/16/2020  Primary Cardiologist: New to [facility] MD  Subjective   Overnight had liver MRI with no evidence of abscess.  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  Had issues with pain over night not improved by dilaudid.  Saw GI team.  Pending CMR today.  Patient notes no chest pain overnight but does not abdominal pain.  Does note aching chest that has improved.  Inpatient Medications    Scheduled Meds:  colchicine  0.6 mg Oral BID   mesalamine  2.4 g Oral Q breakfast   pantoprazole  40 mg Oral Q1200   predniSONE  40 mg Oral Q breakfast   Continuous Infusions:   PRN Meds: HYDROmorphone (DILAUDID) injection, ketorolac, ondansetron   Vital Signs    Vitals:   11/16/20 0030 11/16/20 0420 11/16/20 0430 11/16/20 0741  BP: 104/67 105/67  102/73  Pulse: 95 100  98  Resp: 18 18  20   Temp: 98.8 F (37.1 C) 98.3 F (36.8 C)  98.4 F (36.9 C)  TempSrc: Oral Oral  Oral  SpO2: 100% 100%  100%  Weight:   69.4 kg   Height:        Intake/Output Summary (Last 24 hours) at 11/16/2020 0830 Last data filed at 11/15/2020 2133 Gross per 24 hour  Intake 1669.64 ml  Output --  Net 1669.64 ml   Filed Weights   11/14/20 0450 11/15/20 0148 11/16/20 0430  Weight: 69.4 kg 70.4 kg 69.4 kg    Telemetry    Sinus Tachycardia rates improved - Personally Reviewed  ECG    Sinus tachycardia rate 134 no ST/T changes - Personally Reviewed  Physical Exam   GEN: No acute distress.   Neck: No JVD Cardiac: Regular tachycardia, no murmurs, no rubs, or gallops.  Respiratory: Clear to auscultation bilaterally. GI: Soft, nontender, non-distended  MS: No edema; No deformity. Neuro:  Nonfocal  Psych: Normal affect   Labs    Chemistry Recent Labs  Lab 11/14/20 0520 11/14/20 0922 11/15/20 0122 11/16/20 0111  NA 135  --  133* 136  K 3.5  --  3.7 3.9  CL 101  --  106 107  CO2 23   --  19* 22  GLUCOSE 148*  --  98 137*  BUN 10  --  6 <5*  CREATININE 0.87  --  0.67 0.61  CALCIUM 8.8*  --  8.0* 8.3*  PROT  --  6.4* 5.0* 5.4*  ALBUMIN  --  2.9* 2.1* 2.1*  AST  --  66* 16 26  ALT  --  27 20 31   ALKPHOS  --  66 49 55  BILITOT  --  1.8* 0.5 0.5  GFRNONAA >60  --  >60 >60  ANIONGAP 11  --  8 7     Hematology Recent Labs  Lab 11/14/20 0520 11/15/20 0122 11/16/20 0111  WBC 8.0 6.4 5.4  RBC 4.30 3.84* 3.88  HGB 9.9* 8.8* 8.8*  HCT 32.4* 28.9* 29.4*  MCV 75.3* 75.3* 75.8*  MCH 23.0* 22.9* 22.7*  MCHC 30.6 30.4 29.9*  RDW 17.4* 17.2* 17.2*  PLT 633* 529* 587*    Cardiac EnzymesNo results for input(s): TROPONINI in the last 168 hours. No results for input(s): TROPIPOC in the last 168 hours.   BNPNo results for input(s): BNP, PROBNP in the last 168 hours.   DDimer  Recent Labs  Lab 11/14/20 0520  DDIMER 1.88*  Radiology    MR LIVER W WO CONTRAST  Result Date: 11/15/2020 CLINICAL DATA:  Inpatient. Fever and hypotension. Inflammatory bowel disease diagnosed in 2016, lost to follow-up. Indeterminate left liver mass on chest CT angiogram from yesterday, concern for abscess. Abdominal abscess/infection suspected EXAM: MRI ABDOMEN WITHOUT AND WITH CONTRAST TECHNIQUE: Multiplanar multisequence MR imaging of the abdomen was performed both before and after the administration of intravenous contrast. CONTRAST:  55m GADAVIST GADOBUTROL 1 MMOL/ML IV SOLN COMPARISON:  11/14/2020 chest CT angiogram. 10/07/2020 CT abdomen/pelvis. FINDINGS: Lower chest: Hypoventilatory changes at the dependent lung bases. Hepatobiliary: Normal liver size and configuration. No hepatic steatosis. There is a 3.5 x 2.9 cm segment 4A left liver mass (series 901/image 45) with avid heterogeneous nodular arterial phase enhancement, relative isoenhancement on portal venous phase and relatively occult appearance on T1 and T2 imaging without evidence of internal lipid on chemical shift imaging, all  most compatible with focal nodular hyperplasia. No additional liver masses. Normal gallbladder with no cholelithiasis. No biliary ductal dilatation. Common bile duct diameter 3 mm. No choledocholithiasis. No biliary masses, strictures or beading. Pancreas: No pancreatic mass or duct dilation.  No pancreas divisum. Spleen: Normal size. No mass. Adrenals/Urinary Tract: Normal adrenals. No hydronephrosis. Normal kidneys with no renal mass. Stomach/Bowel: Normal non-distended stomach. Normal caliber visualized small and large bowel loops. Mild generalized wall thickening and hyperenhancement in the visualized left colon without evidence of acute pericolonic inflammatory changes. Vascular/Lymphatic: Normal caliber abdominal aorta. Patent portal, splenic, hepatic and renal veins. No pathologically enlarged lymph nodes in the abdomen. Other: No abdominal ascites or focal fluid collection. Musculoskeletal: No aggressive appearing focal osseous lesions. IMPRESSION: 1. Hypervascular segment 4A left liver mass measuring 3.5 x 2.9 cm with MRI features most compatible with focal nodular hyperplasia (FDeWitt. Follow-up MRI abdomen without and with IV contrast recommended in 6 months, to be performed with EOVIST IV contrast. No evidence of a liver abscess. 2. Mild generalized wall thickening and hyperenhancement in the visualized left colon without evidence of acute pericolonic inflammatory changes, compatible with known inflammatory bowel disease. Electronically Signed   By: JIlona SorrelM.D.   On: 11/15/2020 17:02   DG Chest Port 1 View  Result Date: 11/15/2020 CLINICAL DATA:  Sepsis. EXAM: PORTABLE CHEST 1 VIEW COMPARISON:  Chest radiograph dated 11/14/2020 and CT dated 11/14/2020 FINDINGS: The heart size and mediastinal contours are within normal limits. Both lungs are clear. The visualized skeletal structures are unremarkable. IMPRESSION: No active disease. Electronically Signed   By: AAnner CreteM.D.   On: 11/15/2020  01:34   ECHOCARDIOGRAM COMPLETE  Result Date: 11/14/2020    ECHOCARDIOGRAM REPORT   Patient Name:   Robin Fuller Date of Exam: 11/14/2020 Medical Rec #:  [id number]              Height:       65.0 in Accession #:    [id number]             Weight:       153.0 lb Date of Birth:  6Jul 29, 1997               BSA:          1.765 m Patient Age:    25 years                BP:           100/69 mmHg Patient Gender: F  HR:           116 bpm. Exam Location:  Inpatient Procedure: 2D Echo, 3D Echo, Cardiac Doppler and Color Doppler STAT ECHO Indications:    Elevated Troponin  History:        Patient has no prior history of Echocardiogram examinations.                 Arrythmias:Tachycardia; Signs/Symptoms:Chest Pain. Ulcerative                 collitis. H/O Covid 19. Suspected myocarditis/pericarditis.                 Chest pain for two days.  Sonographer:    Merrie Roof RDCS Referring Phys: [id number] [place]  1. Left ventricular ejection fraction, by estimation, is 60 to 65%. Left ventricular ejection fraction by 3D volume is 57 %. The left ventricle has normal function. The left ventricle has no regional wall motion abnormalities. Left ventricular diastolic  parameters were normal.  2. Right ventricular systolic function is normal. The right ventricular size is normal.  3. The mitral valve is normal in structure. No evidence of mitral valve regurgitation. No evidence of mitral stenosis.  4. The aortic valve is normal in structure. Aortic valve regurgitation is not visualized. No aortic stenosis is present.  5. The inferior vena cava is normal in size with greater than 50% respiratory variability, suggesting right atrial pressure of 3 mmHg. FINDINGS  Left Ventricle: Left ventricular ejection fraction, by estimation, is 60 to 65%. Left ventricular ejection fraction by 3D volume is 57 %. The left ventricle has normal function. The left ventricle has no regional wall motion  abnormalities. The left ventricular internal cavity size was normal in size. There is no left ventricular hypertrophy. Left ventricular diastolic parameters were normal. Normal left ventricular filling pressure. Right Ventricle: The right ventricular size is normal. No increase in right ventricular wall thickness. Right ventricular systolic function is normal. Left Atrium: Left atrial size was normal in size. Right Atrium: Right atrial size was normal in size. Pericardium: There is no evidence of pericardial effusion. Mitral Valve: The mitral valve is normal in structure. No evidence of mitral valve regurgitation. No evidence of mitral valve stenosis. Tricuspid Valve: The tricuspid valve is normal in structure. Tricuspid valve regurgitation is not demonstrated. No evidence of tricuspid stenosis. Aortic Valve: The aortic valve is normal in structure. Aortic valve regurgitation is not visualized. No aortic stenosis is present. Aortic valve mean gradient measures 5.0 mmHg. Aortic valve peak gradient measures 8.8 mmHg. Aortic valve area, by VTI measures 2.31 cm. Pulmonic Valve: The pulmonic valve was normal in structure. Pulmonic valve regurgitation is not visualized. No evidence of pulmonic stenosis. Aorta: The aortic root is normal in size and structure. Venous: The inferior vena cava is normal in size with greater than 50% respiratory variability, suggesting right atrial pressure of 3 mmHg. IAS/Shunts: No atrial level shunt detected by color flow Doppler.  LEFT VENTRICLE PLAX 2D LVIDd:         4.60 cm         Diastology LVIDs:         3.40 cm         LV e' medial:  12.30 cm/s LV PW:         0.90 cm         LV e' lateral: 12.90 cm/s LV IVS:        0.70 cm LVOT diam:  1.90 cm LV SV:         50              3D Volume EF LV SV Index:   28              LV 3D EF:    Left LVOT Area:     2.84 cm                     ventricul                                             ar                                              ejection                                             fraction                                             by 3D                                             volume is                                             57 %.                                 3D Volume EF:                                3D EF:        57 %                                LV EDV:       90 ml                                LV ESV:       39 ml                                LV SV:        51 ml RIGHT VENTRICLE RV Basal diam:  2.20 cm RV S prime:     12.10 cm/s TAPSE (M-mode): 1.6 cm LEFT ATRIUM             Index  RIGHT ATRIUM           Index LA diam:        3.00 cm 1.70 cm/m  RA Area:     15.70 cm LA Vol (A2C):   20.6 ml 11.67 ml/m RA Volume:   35.30 ml  20.00 ml/m LA Vol (A4C):   31.1 ml 17.62 ml/m LA Biplane Vol: 25.5 ml 14.45 ml/m  AORTIC VALVE AV Area (Vmax):    2.22 cm AV Area (Vmean):   2.26 cm AV Area (VTI):     2.31 cm AV Vmax:           148.00 cm/s AV Vmean:          104.000 cm/s AV VTI:            0.215 m AV Peak Grad:      8.8 mmHg AV Mean Grad:      5.0 mmHg LVOT Vmax:         116.00 cm/s LVOT Vmean:        83.000 cm/s LVOT VTI:          0.175 m LVOT/AV VTI ratio: 0.81  AORTA Ao Root diam: 2.40 cm Ao Asc diam:  1.90 cm  SHUNTS Systemic VTI:  0.18 m Systemic Diam: 1.90 cm Dani Gobble Croitoru MD Electronically signed by Sanda Klein MD Signature Date/Time: 11/14/2020/5:26:55 PM    Final      Patient Profile     25 y.o. female with history of Crohn's disease with troponin elevation, liver mass, fevers and hypotension in the setting of preserved LV function.  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 Question of adrenal insufficiency on steroids Liver adenoma - troponin has peaked - no significant pericardial effusion or LV dysfunction - on colchicine - patient had deferred NSAIDs for chest pain during this admission but if persistent pericarditic pain we  may need to trial this medication  - CMR pending today - tachycardia is sinus in nature and appears reactive to her illness - if BP tolerates, can add low dose BB for question of pericarditis - On steroids for mixed picture Crohn's, question of adrenal insufficiency, and for myocarditis, this may delay taper   Patient is eager to return home to her child.  Understands activity restrictions in the setting of presumed myocarditis.  IF CMR  is consistent with above, patients pain is improved, and IM/GI issues are addressed, reasonable DC.  We will work on f/u in that regard  For questions or updates, please contact [place] Please consult [email or web address] for contact info under Cardiology/STEMI.      Signed, Werner Lean, MD  11/16/2020, 8:30 AM

## 2020-11-16 NOTE — Progress Notes (Signed)
Daily Rounding Note  11/16/2020, 12:13 PM  LOS: 2 days   SUBJECTIVE:   Chief complaint:   Chest pain.  Liver lesion.  IBD. No chest or abd pain.  No bloody stools, no vomiting.  Feels like she should be at home.    OBJECTIVE:         Vital signs in last 24 hours:    Temp:  [98.3 F (36.8 C)-99.3 F (37.4 C)] 99 F (37.2 C) (08/25 0900) Pulse Rate:  [95-115] 98 (08/25 0741) Resp:  [17-20] 20 (08/25 0741) BP: (93-105)/(58-73) 99/61 (08/25 0900) SpO2:  [99 %-100 %] 100 % (08/25 0900) Weight:  [69.4 kg] 69.4 kg (08/25 0430) Last BM Date: 11/16/20 Filed Weights   11/14/20 0450 11/15/20 0148 11/16/20 0430  Weight: 69.4 kg 70.4 kg 69.4 kg   General: Looks well. Heart: RRR Chest: Labored breathing Abdomen: Soft, nontender.  Active bowel sounds. Extremities: No CCE. Neuro/Psych: Alert.  Appropriate.  Oriented x3  Intake/Output from previous day: 08/24 0701 - 08/25 0700 In: 1789.6 [P.O.:1080; I.V.:709.6] Out: -   Intake/Output this shift: Total I/O In: 360 [P.O.:360] Out: -   Lab Results: Recent Labs    11/14/20 0520 11/15/20 0122 11/16/20 0111  WBC 8.0 6.4 5.4  HGB 9.9* 8.8* 8.8*  HCT 32.4* 28.9* 29.4*  PLT 633* 529* 587*   BMET Recent Labs    11/14/20 0520 11/15/20 0122 11/16/20 0111  NA 135 133* 136  K 3.5 3.7 3.9  CL 101 106 107  CO2 23 19* 22  GLUCOSE 148* 98 137*  BUN 10 6 <5*  CREATININE 0.87 0.67 0.61  CALCIUM 8.8* 8.0* 8.3*   LFT Recent Labs    11/14/20 0922 11/15/20 0122 11/16/20 0111  PROT 6.4* 5.0* 5.4*  ALBUMIN 2.9* 2.1* 2.1*  AST 66* 16 26  ALT 27 20 31   ALKPHOS 66 49 55  BILITOT 1.8* 0.5 0.5  BILIDIR 0.8*  --   --   IBILI 1.0*  --   --    PT/INR Recent Labs    11/15/20 0136  LABPROT 14.3  INR 1.1   Hepatitis Panel No results for input(s): HEPBSAG, HCVAB, HEPAIGM, HEPBIGM in the last 72 hours.  Studies/Results: MR LIVER W WO CONTRAST  Result Date:  11/15/2020 CLINICAL DATA:  Inpatient. Fever and hypotension. Inflammatory bowel disease diagnosed in 2016, lost to follow-up. Indeterminate left liver mass on chest CT angiogram from yesterday, concern for abscess. Abdominal abscess/infection suspected EXAM: MRI ABDOMEN WITHOUT AND WITH CONTRAST TECHNIQUE: Multiplanar multisequence MR imaging of the abdomen was performed both before and after the administration of intravenous contrast. CONTRAST:  33m GADAVIST GADOBUTROL 1 MMOL/ML IV SOLN COMPARISON:  11/14/2020 chest CT angiogram. 10/07/2020 CT abdomen/pelvis. FINDINGS: Lower chest: Hypoventilatory changes at the dependent lung bases. Hepatobiliary: Normal liver size and configuration. No hepatic steatosis. 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 No additional liver masses. Normal gallbladder with no cholelithiasis. No biliary ductal dilatation. Common bile duct diameter 3 mm. No choledocholithiasis. No biliary masses, strictures or beading. Pancreas: No pancreatic mass or duct dilation.  No pancreas divisum. Spleen: Normal size. No mass. Adrenals/Urinary Tract: Normal adrenals. No hydronephrosis. Normal kidneys with no renal  mass. Stomach/Bowel: Normal non-distended stomach. Normal caliber visualized small and large bowel loops. Mild generalized wall thickening and hyperenhancement in the visualized left colon without evidence of acute pericolonic inflammatory changes. Vascular/Lymphatic: Normal caliber abdominal aorta. Patent portal, splenic, hepatic and renal veins. No pathologically enlarged lymph nodes in the abdomen. Other: No abdominal ascites or focal fluid collection. Musculoskeletal: No aggressive  appearing focal osseous lesions. IMPRESSION: 1. Hypervascular segment 4A left liver mass measuring 3.5 x 2.9 cm with MRI features most compatible with focal nodular hyperplasia ([place]). Follow-up MRI abdomen without and with IV contrast recommended in 6 months, to be performed with EOVIST IV contrast. No evidence of a liver abscess. 2. Mild generalized wall thickening and hyperenhancement in the visualized left colon without evidence of acute pericolonic inflammatory changes, compatible with known inflammatory bowel disease. Electronically Signed   By: Ilona Sorrel M.D.   On: 11/15/2020 17:02   DG Chest Port 1 View  Result Date: 11/15/2020 CLINICAL DATA:  Sepsis. EXAM: PORTABLE CHEST 1 VIEW COMPARISON:  Chest radiograph dated 11/14/2020 and CT dated 11/14/2020 FINDINGS: The heart size and mediastinal contours are within normal limits. Both lungs are clear. The visualized skeletal structures are unremarkable. IMPRESSION: No active disease. Electronically Signed   By: Anner Crete M.D.   On: 11/15/2020 01:34   ECHOCARDIOGRAM COMPLETE  Result Date: 11/14/2020  IMPRESSIONS  1. Left ventricular ejection fraction, by estimation, is 60 to 65%. Left ventricular ejection fraction by 3D volume is 57 %. The left ventricle has normal function. The left ventricle has no regional wall motion abnormalities. Left ventricular diastolic  parameters were normal.  2. Right ventricular systolic function is normal. The right ventricular size is normal.  3. The mitral valve is normal in structure. No evidence of mitral valve regurgitation. No evidence of mitral stenosis.  4. The aortic valve is normal in structure. Aortic valve regurgitation is not visualized. No aortic stenosis is present.  5. The inferior vena cava is normal in size with greater than 50% respiratory variability, suggesting right atrial pressure of 3 mmHg. Electronically signed by Sanda Klein MD Signature Date/Time: 11/14/2020/5:26:55 PM    Final      Scheduled Meds:  colchicine  0.6 mg Oral BID   mesalamine  2.4 g Oral Q breakfast   pantoprazole  40 mg Oral Q1200   [START ON 11/17/2020] predniSONE  30 mg Oral Q breakfast   Continuous Infusions: PRN Meds:.HYDROmorphone (DILAUDID) injection, ketorolac, ondansetron   ASSESMENT:      Ulcerative colitis, possible Crohn's disease.  Diagnosed 2016.  Biopsies from 2017 with quiescent colitis and felt to be in remission.  Treated with intermittent steroids, declined mesalamine in past.  Last colonoscopy with Dr. Jennefer Bravo in [facility] in 2019 or 2020.  CT scan of 09/2220 suggest distal left and rectal colitis.  CRP 21.  MRI shows wall thickening, hyperenhancement in L colon c/w IBD.  Now on mesalamine 2.4 mg daily, prednisone 30 mg daily, was 40 mg for previous couple of days.     Liver lesion.  MRI reading is that of focal nodular hyperplasia.  Recommend follow-up MRI in 6 months.  No liver abscess.       Anemia, microcytic.  Hgb 8.8.  Down from a month ago when it was around 19.  Iron low at 9.  Iron sats low at 3%, ferritin 48.  Normal TIBC.  Chest pain.  Concern for myopericarditis.  Cardiac MRI planned.  Patient does not want to take ibuprofen  due to stomach upset but continues on aspirin and colchicine.  Hyponatremia.     Elevated troponins.   PLAN     Should keep upcoming appt in September w Dr Mitchell Heir in [facility].  GI signing off.    Azucena Freed  11/16/2020, 12:13 PM Phone [phone]

## 2020-11-16 NOTE — Progress Notes (Signed)
PROGRESS NOTE        PATIENT DETAILS Name: Robin Fuller Age: 25 y.o. Sex: female Date of Birth: 29-Aug-1995 Admit Date: 11/14/2020 Admitting Physician Karmen Bongo, MD HBZ:JIRCVE, Caffie Damme, MD  Brief Narrative: Patient is a 25 y.o. female UC/Crohn's-diagnosed 2016-subsequently lost to follow-up-goes to the ED for periodic flares for which she is prescribed a prednisone-per patient-she has been on prednisone continuously since 7/16-and abruptly stopped taking it 2-3 days prior to this hospital stay.  She presented to the hospital with chest pain-however prior to developing chest pain-she had pain in her right first MCP joint, right shoulder.  Significant events: 8/23>> admit for eval of chest pain  Significant studies: 7/16>> CT abdomen/pelvis: Colitis involving rectum up to transverse colon.   8/23>> CTA chest: No PE-enhancing lesion seen in liver. 8/23>> Echo: EF 60-65%, no pericardial effusion. 8/23>> CRP: 21.3. 8/24>> MRI liver: Liver lesion seen on CT-consistent  focal nodular hyperplasia  Antimicrobial therapy: None  Microbiology data: 8/23>> influenza/COVID PCR: Negative 8/24>> blood cultures: Negative  Procedures : None  Consults: Cardiology, GI.  DVT Prophylaxis : SCDs Start: 11/14/20 1549   Subjective: Overall better-describes the chest pain as mostly sore but not as intense as it was when she first came into the hospital.   Assessment/Plan: Chest pain: Concern for myopericarditis-cardiology following-cardiac MRI planned today.  She does not want to continue ibuprofen (claims it irritates her stomach and flares IBD)-but is agreeable to continue aspirin.  Remains on colchicine.  On steroids which will be tapered gradually for her IBD.  Cardiology following.  Fever/transient hypotension: Resolved-BP now stable-suspect she had relative adrenal insufficiency that caused hypotension (abruptly stopped steroids after being on  it for 5 weeks)-wonder if the fever is from a viral syndrome that possibly could be causing her to have pericarditis.  Thankfully MRI of the liver does not show an abscess.  Blood cultures negative so far.    Inflammatory bowel disease: Appreciate GI input-she has been lost to follow-up-and has been taking steroids intermittently-but on steroids consistently since 7/16-and abruptly stopped a few days prior to this hospitalization.  GI recommends outpatient follow-up with her primary GI MD-patient claims she has a appointment coming up in early September.  Restarted on stress doses of prednisone on 8/24-we will plan to start tapering over the next few days.  Microcytic anemia: Consistent with iron deficiency-unclear whether this is related to GI loss from colitis or she has menorrhagia.  Plan is to follow CBC.  Diet: Diet Order             Diet regular Room service appropriate? Yes; Fluid consistency: Thin  Diet effective now                    Code Status: Full code   Family Communication: None at bedside  Disposition Plan: Status is: Inpatient  Remains inpatient appropriate because:Inpatient level of care appropriate due to severity of illness  Dispo: The patient is from: Home              Anticipated d/c is to: Home              Patient currently is not medically stable to d/c.   Difficult to place patient No   Barriers to Discharge: Ongoing chest pain-fever-possible liver lesion-needs further work-up inpatient for safe discharge.  See above note.  Antimicrobial agents: Anti-infectives (From admission, onward)    None        Time spent: 35 minutes-Greater than 50% of this time was spent in counseling, explanation of diagnosis, planning of further management, and coordination of care.  MEDICATIONS: Scheduled Meds:  colchicine  0.6 mg Oral BID   mesalamine  2.4 g Oral Q breakfast   pantoprazole  40 mg Oral Q1200   predniSONE  40 mg Oral Q breakfast    Continuous Infusions:   PRN Meds:.HYDROmorphone (DILAUDID) injection, ketorolac, ondansetron   PHYSICAL EXAM: Vital signs: Vitals:   11/16/20 0420 11/16/20 0430 11/16/20 0741 11/16/20 0900  BP: 105/67  102/73 99/61  Pulse: 100  98   Resp: 18  20   Temp: 98.3 F (36.8 C)  98.4 F (36.9 C) 99 F (37.2 C)  TempSrc: Oral  Oral Oral  SpO2: 100%  100% 100%  Weight:  69.4 kg    Height:       Filed Weights   11/14/20 0450 11/15/20 0148 11/16/20 0430  Weight: 69.4 kg 70.4 kg 69.4 kg   Body mass index is 25.48 kg/m.   Gen Exam:Alert awake-not in any distress HEENT:atraumatic, normocephalic Chest: B/L clear to auscultation anteriorly CVS:S1S2 regular Abdomen:soft non tender, non distended Extremities:no edema Neurology: Non focal Skin: no rash   I have personally reviewed following labs and imaging studies  LABORATORY DATA: CBC: Recent Labs  Lab 11/14/20 0520 11/15/20 0122 11/16/20 0111  WBC 8.0 6.4 5.4  NEUTROABS 4.7 3.7  --   HGB 9.9* 8.8* 8.8*  HCT 32.4* 28.9* 29.4*  MCV 75.3* 75.3* 75.8*  PLT 633* 529* 587*     Basic Metabolic Panel: Recent Labs  Lab 11/14/20 0520 11/15/20 0122 11/16/20 0111  NA 135 133* 136  K 3.5 3.7 3.9  CL 101 106 107  CO2 23 19* 22  GLUCOSE 148* 98 137*  BUN 10 6 <5*  CREATININE 0.87 0.67 0.61  CALCIUM 8.8* 8.0* 8.3*     GFR: Estimated Creatinine Clearance: 106.1 mL/min (by C-G formula based on SCr of 0.61 mg/dL).  Liver Function Tests: Recent Labs  Lab 11/14/20 0922 11/15/20 0122 11/16/20 0111  AST 66* 16 26  ALT 27 20 31   ALKPHOS 66 49 55  BILITOT 1.8* 0.5 0.5  PROT 6.4* 5.0* 5.4*  ALBUMIN 2.9* 2.1* 2.1*    No results for input(s): LIPASE, AMYLASE in the last 168 hours. No results for input(s): AMMONIA in the last 168 hours.  Coagulation Profile: Recent Labs  Lab 11/15/20 0136  INR 1.1     Cardiac Enzymes: No results for input(s): CKTOTAL, CKMB, CKMBINDEX, TROPONINI in the last 168 hours.  BNP  (last 3 results) No results for input(s): PROBNP in the last 8760 hours.  Lipid Profile: No results for input(s): CHOL, HDL, LDLCALC, TRIG, CHOLHDL, LDLDIRECT in the last 72 hours.  Thyroid Function Tests: Recent Labs    11/14/20 1632  TSH 0.644     Anemia Panel: Recent Labs    11/14/20 1632  FERRITIN 48  TIBC 270  IRON 9*     Urine analysis:    Component Value Date/Time   COLORURINE YELLOW 11/15/2020 [place] 11/15/2020 1035   LABSPEC 1.008 11/15/2020 1035   PHURINE 7.0 11/15/2020 1035   GLUCOSEU >=500 (A) 11/15/2020 1035   HGBUR MODERATE (A) 11/15/2020 1035   BILIRUBINUR NEGATIVE 11/15/2020 1035   KETONESUR 5 (A) 11/15/2020 1035   PROTEINUR NEGATIVE 11/15/2020 1035   UROBILINOGEN 1.0  07/06/2014 2148   NITRITE NEGATIVE 11/15/2020 1035   LEUKOCYTESUR NEGATIVE 11/15/2020 1035    Sepsis Labs: Lactic Acid, Venous    Component Value Date/Time   LATICACIDVEN 1.3 11/15/2020 0137    MICROBIOLOGY: Recent Results (from the past 240 hour(s))  Resp Panel by RT-PCR (Flu A&B, Covid) Nasopharyngeal Swab     Status: None   Collection Time: 11/14/20  6:37 AM   Specimen: Nasopharyngeal Swab; Nasopharyngeal(NP) swabs in vial transport medium  Result Value Ref Range Status   SARS Coronavirus 2 by RT PCR NEGATIVE NEGATIVE Final    Comment: (NOTE) SARS-CoV-2 target nucleic acids are NOT DETECTED.  The SARS-CoV-2 RNA is generally detectable in upper respiratory specimens during the acute phase of infection. The lowest concentration of SARS-CoV-2 viral copies this assay can detect is 138 copies/mL. A negative result does not preclude SARS-Cov-2 infection and should not be used as the sole basis for treatment or other patient management decisions. A negative result may occur with  improper specimen collection/handling, submission of specimen other than nasopharyngeal swab, presence of viral mutation(s) within the areas targeted by this assay, and inadequate  number of viral copies(<138 copies/mL). A negative result must be combined with clinical observations, patient history, and epidemiological information. The expected result is Negative.  Fact Sheet for Patients:  EntrepreneurPulse.com.au  Fact Sheet for Healthcare Providers:  IncredibleEmployment.be  This test is no t yet approved or cleared by the [place] FDA and  has been authorized for detection and/or diagnosis of SARS-CoV-2 by FDA under an Emergency Use Authorization (EUA). This EUA will remain  in effect (meaning this test can be used) for the duration of the COVID-19 declaration under Section 564(b)(1) of the Act, 21 U.S.C.section 360bbb-3(b)(1), unless the authorization is terminated  or revoked sooner.       Influenza A by PCR NEGATIVE NEGATIVE Final   Influenza B by PCR NEGATIVE NEGATIVE Final    Comment: (NOTE) The Xpert Xpress SARS-CoV-2/FLU/RSV plus assay is intended as an aid in the diagnosis of influenza from Nasopharyngeal swab specimens and should not be used as a sole basis for treatment. Nasal washings and aspirates are unacceptable for Xpert Xpress SARS-CoV-2/FLU/RSV testing.  Fact Sheet for Patients: EntrepreneurPulse.com.au  Fact Sheet for Healthcare Providers: IncredibleEmployment.be  This test is not yet approved or cleared by the [place] FDA and has been authorized for detection and/or diagnosis of SARS-CoV-2 by FDA under an Emergency Use Authorization (EUA). This EUA will remain in effect (meaning this test can be used) for the duration of the COVID-19 declaration under Section 564(b)(1) of the Act, 21 U.S.C. section 360bbb-3(b)(1), unless the authorization is terminated or revoked.  Performed at [facility], [address] [place], [place] [address]   Culture, blood (routine x 2)     Status: None (Preliminary result)   Collection Time: 11/15/20   1:18 AM   Specimen: BLOOD  Result Value Ref Range Status   Specimen Description BLOOD UPPER RIGHT HAND  Final   Special Requests   Final    BOTTLES DRAWN AEROBIC ONLY Blood Culture adequate volume   Culture   Final    NO GROWTH 1 DAY Performed at [facility], [place] [address] [place], [place] [address]    Report Status PENDING  Incomplete  Culture, blood (routine x 2)     Status: None (Preliminary result)   Collection Time: 11/15/20  1:20 AM   Specimen: BLOOD  Result Value Ref Range Status  Specimen Description BLOOD LOWER RIGHT HAND  Final   Special Requests   Final    BOTTLES DRAWN AEROBIC ONLY Blood Culture adequate volume   Culture   Final    NO GROWTH 1 DAY Performed at [facility], [address] [place],  [address]    Report Status PENDING  Incomplete    RADIOLOGY STUDIES/RESULTS: MR LIVER W WO CONTRAST  Result Date: 11/15/2020 CLINICAL DATA:  Inpatient. Fever and hypotension. Inflammatory bowel disease diagnosed in 2016, lost to follow-up. Indeterminate left liver mass on chest CT angiogram from yesterday, concern for abscess. Abdominal abscess/infection suspected EXAM: MRI ABDOMEN WITHOUT AND WITH CONTRAST TECHNIQUE: Multiplanar multisequence MR imaging of the abdomen was performed both before and after the administration of intravenous contrast. CONTRAST:  7m GADAVIST GADOBUTROL 1 MMOL/ML IV SOLN COMPARISON:  11/14/2020 chest CT angiogram. 10/07/2020 CT abdomen/pelvis. FINDINGS: Lower chest: Hypoventilatory changes at the dependent lung bases. Hepatobiliary: Normal liver size and configuration. No hepatic steatosis. There is a 3.5 x 2.9 cm segment 4A left liver mass (series 901/image 45) with avid heterogeneous nodular arterial phase enhancement, relative isoenhancement on portal venous phase and relatively occult appearance on T1 and T2 imaging without evidence of internal lipid on chemical shift imaging, all most compatible with focal nodular  hyperplasia. No additional liver masses. Normal gallbladder with no cholelithiasis. No biliary ductal dilatation. Common bile duct diameter 3 mm. No choledocholithiasis. No biliary masses, strictures or beading. Pancreas: No pancreatic mass or duct dilation.  No pancreas divisum. Spleen: Normal size. No mass. Adrenals/Urinary Tract: Normal adrenals. No hydronephrosis. Normal kidneys with no renal mass. Stomach/Bowel: Normal non-distended stomach. Normal caliber visualized small and large bowel loops. Mild generalized wall thickening and hyperenhancement in the visualized left colon without evidence of acute pericolonic inflammatory changes. Vascular/Lymphatic: Normal caliber abdominal aorta. Patent portal, splenic, hepatic and renal veins. No pathologically enlarged lymph nodes in the abdomen. Other: No abdominal ascites or focal fluid collection. Musculoskeletal: No aggressive appearing focal osseous lesions. IMPRESSION: 1. Hypervascular segment 4A left liver mass measuring 3.5 x 2.9 cm with MRI features most compatible with focal nodular hyperplasia ([place]. Follow-up MRI abdomen without and with IV contrast recommended in 6 months, to be performed with EOVIST IV contrast. No evidence of a liver abscess. 2. Mild generalized wall thickening and hyperenhancement in the visualized left colon without evidence of acute pericolonic inflammatory changes, compatible with known inflammatory bowel disease. Electronically Signed   By: JIlona SorrelM.D.   On: 11/15/2020 17:02   DG Chest Port 1 View  Result Date: 11/15/2020 CLINICAL DATA:  Sepsis. EXAM: PORTABLE CHEST 1 VIEW COMPARISON:  Chest radiograph dated 11/14/2020 and CT dated 11/14/2020 FINDINGS: The heart size and mediastinal contours are within normal limits. Both lungs are clear. The visualized skeletal structures are unremarkable. IMPRESSION: No active disease. Electronically Signed   By: AAnner CreteM.D.   On: 11/15/2020 01:34   ECHOCARDIOGRAM  COMPLETE  Result Date: 11/14/2020    ECHOCARDIOGRAM REPORT   Patient Name:   KNICHOLECAMPBELL-MANNING Date of Exam: 11/14/2020 Medical Rec #:  [id number]              Height:       65.0 in Accession #:    [id number]             Weight:       153.0 lb Date of Birth:  607/16/1997  BSA:          1.765 m Patient Age:    25 years                BP:           100/69 mmHg Patient Gender: F                       HR:           116 bpm. Exam Location:  Inpatient Procedure: 2D Echo, 3D Echo, Cardiac Doppler and Color Doppler STAT ECHO Indications:    Elevated Troponin  History:        Patient has no prior history of Echocardiogram examinations.                 Arrythmias:Tachycardia; Signs/Symptoms:Chest Pain. Ulcerative                 collitis. H/O Covid 19. Suspected myocarditis/pericarditis.                 Chest pain for two days.  Sonographer:    Merrie Roof RDCS Referring Phys: [id number] [place]  1. Left ventricular ejection fraction, by estimation, is 60 to 65%. Left ventricular ejection fraction by 3D volume is 57 %. The left ventricle has normal function. The left ventricle has no regional wall motion abnormalities. Left ventricular diastolic  parameters were normal.  2. Right ventricular systolic function is normal. The right ventricular size is normal.  3. The mitral valve is normal in structure. No evidence of mitral valve regurgitation. No evidence of mitral stenosis.  4. The aortic valve is normal in structure. Aortic valve regurgitation is not visualized. No aortic stenosis is present.  5. The inferior vena cava is normal in size with greater than 50% respiratory variability, suggesting right atrial pressure of 3 mmHg. FINDINGS  Left Ventricle: Left ventricular ejection fraction, by estimation, is 60 to 65%. Left ventricular ejection fraction by 3D volume is 57 %. The left ventricle has normal function. The left ventricle has no regional wall motion abnormalities. The left  ventricular internal cavity size was normal in size. There is no left ventricular hypertrophy. Left ventricular diastolic parameters were normal. Normal left ventricular filling pressure. Right Ventricle: The right ventricular size is normal. No increase in right ventricular wall thickness. Right ventricular systolic function is normal. Left Atrium: Left atrial size was normal in size. Right Atrium: Right atrial size was normal in size. Pericardium: There is no evidence of pericardial effusion. Mitral Valve: The mitral valve is normal in structure. No evidence of mitral valve regurgitation. No evidence of mitral valve stenosis. Tricuspid Valve: The tricuspid valve is normal in structure. Tricuspid valve regurgitation is not demonstrated. No evidence of tricuspid stenosis. Aortic Valve: The aortic valve is normal in structure. Aortic valve regurgitation is not visualized. No aortic stenosis is present. Aortic valve mean gradient measures 5.0 mmHg. Aortic valve peak gradient measures 8.8 mmHg. Aortic valve area, by VTI measures 2.31 cm. Pulmonic Valve: The pulmonic valve was normal in structure. Pulmonic valve regurgitation is not visualized. No evidence of pulmonic stenosis. Aorta: The aortic root is normal in size and structure. Venous: The inferior vena cava is normal in size with greater than 50% respiratory variability, suggesting right atrial pressure of 3 mmHg. IAS/Shunts: No atrial level shunt detected by color flow Doppler.  LEFT VENTRICLE PLAX 2D LVIDd:         4.60 cm  Diastology LVIDs:         3.40 cm         LV e' medial:  12.30 cm/s LV PW:         0.90 cm         LV e' lateral: 12.90 cm/s LV IVS:        0.70 cm LVOT diam:     1.90 cm LV SV:         50              3D Volume EF LV SV Index:   28              LV 3D EF:    Left LVOT Area:     2.84 cm                     ventricul                                             ar                                             ejection                                              fraction                                             by 3D                                             volume is                                             57 %.                                 3D Volume EF:                                3D EF:        57 %                                LV EDV:       90 ml                                LV ESV:       39 ml  LV SV:        51 ml RIGHT VENTRICLE RV Basal diam:  2.20 cm RV S prime:     12.10 cm/s TAPSE (M-mode): 1.6 cm LEFT ATRIUM             Index       RIGHT ATRIUM           Index LA diam:        3.00 cm 1.70 cm/m  RA Area:     15.70 cm LA Vol (A2C):   20.6 ml 11.67 ml/m RA Volume:   35.30 ml  20.00 ml/m LA Vol (A4C):   31.1 ml 17.62 ml/m LA Biplane Vol: 25.5 ml 14.45 ml/m  AORTIC VALVE AV Area (Vmax):    2.22 cm AV Area (Vmean):   2.26 cm AV Area (VTI):     2.31 cm AV Vmax:           148.00 cm/s AV Vmean:          104.000 cm/s AV VTI:            0.215 m AV Peak Grad:      8.8 mmHg AV Mean Grad:      5.0 mmHg LVOT Vmax:         116.00 cm/s LVOT Vmean:        83.000 cm/s LVOT VTI:          0.175 m LVOT/AV VTI ratio: 0.81  AORTA Ao Root diam: 2.40 cm Ao Asc diam:  1.90 cm  SHUNTS Systemic VTI:  0.18 m Systemic Diam: 1.90 cm Dani Gobble Croitoru MD Electronically signed by Sanda Klein MD Signature Date/Time: 11/14/2020/5:26:55 PM    Final      LOS: 2 days   Oren Binet, MD  [facility]    To contact the attending provider between 7A-7P or the covering provider during after hours 7P-7A, please log into the web site [email or web address] and access using universal [place] password for that web site. If you do not have the password, please call the hospital operator.  11/16/2020, 11:30 AM

## 2020-11-16 NOTE — Progress Notes (Signed)
Pt requests K pad for abd cramping. MD paged.

## 2020-11-17 ENCOUNTER — Other Ambulatory Visit: Payer: Self-pay | Admitting: Physician Assistant

## 2020-11-17 DIAGNOSIS — R778 Other specified abnormalities of plasma proteins: Secondary | ICD-10-CM

## 2020-11-17 DIAGNOSIS — B3322 Viral myocarditis: Secondary | ICD-10-CM

## 2020-11-17 DIAGNOSIS — R079 Chest pain, unspecified: Secondary | ICD-10-CM | POA: Diagnosis not present

## 2020-11-17 LAB — PROCALCITONIN: Procalcitonin: <0.1 ng/mL

## 2020-11-17 MED ORDER — PREDNISONE 10 MG PO TABS: 10.0000 mg | ORAL_TABLET | ORAL | 0 refills | Status: DC

## 2020-11-17 MED ORDER — PANTOPRAZOLE SODIUM 40 MG PO TBEC: 40.0000 mg | DELAYED_RELEASE_TABLET | Freq: Every day | ORAL | 0 refills | Status: AC

## 2020-11-17 MED ORDER — MESALAMINE 1.2 G PO TBEC: 2.4000 g | DELAYED_RELEASE_TABLET | Freq: Every day | ORAL | 1 refills | Status: AC

## 2020-11-17 MED ORDER — COLCHICINE 0.6 MG PO TABS: 0.6000 mg | ORAL_TABLET | Freq: Two times a day (BID) | ORAL | 0 refills | Status: AC

## 2020-11-17 NOTE — Discharge Summary (Signed)
PATIENT DETAILS Name: Robin Fuller Age: 25 y.o. Sex: female Date of Birth: 04/20/1995 MRN: [id number]. Admitting Physician: Karmen Bongo, MD AES:[id number], Caffie Damme, MD  Admit Date: 11/14/2020 Discharge date: 11/17/2020  Recommendations for Outpatient Follow-up:  Follow up with PCP in 1-2 weeks Please obtain CMP/CBC in one week Repeat MRI abdomen with and without contrast in 6 months for focal nodular hyperplasia. Please ensure follow-up with cardiology-if blood pressure tolerates-needs to be started on low-dose beta-blocker. Please ensure outpatient follow-up with GI Please slowly taper of steroids over the next few weeks   Admitted From:  Home  Disposition: [place]: No  Equipment/Devices: None  Discharge Condition: Stable  CODE STATUS: FULL CODE  Diet recommendation:  Diet Order             Diet - low sodium heart healthy           Diet regular Room service appropriate? Yes; Fluid consistency: Thin  Diet effective now                   Brief Narrative: Patient is a 25 y.o. female UC/Crohn's-diagnosed 2016-subsequently lost to follow-up-goes to the ED for periodic flares for which she is prescribed a prednisone-per patient-she has been on prednisone continuously since 7/16-and abruptly stopped taking it 2-3 days prior to this hospital stay.  She presented to the hospital with chest pain-however prior to developing chest pain-she had pain in her right first MCP joint, right shoulder.   Significant events: 8/23>> admit for eval of chest pain   Significant studies: 7/16>> CT abdomen/pelvis: Colitis involving rectum up to transverse colon.   8/23>> CTA chest: No PE-enhancing lesion seen in liver. 8/23>> Echo: EF 60-65%, no pericardial effusion. 8/23>> CRP: 21.3. 8/24>> MRI liver: Liver lesion seen on CT-consistent  focal nodular hyperplasia 8/25>> cardiac MRI: Mild decrease in LV function-meets CMR diagnosis of myocarditis.    Antimicrobial therapy: None   Microbiology data: 8/23>> influenza/COVID PCR: Negative 8/24>> blood cultures: Negative   Procedures : None   Consults: Cardiology, GI.  Brief Hospital Course: Chest pain: Thought to be due to myocarditis-pain has improved.  Patient refused NSAIDs as it flares of her IBD/irritates her stomach.  Ideally needs to be on a beta-blocker per cardiology-but blood pressure too soft to tolerate.  Cardiology will arrange for outpatient follow-up and repeat echocardiogram.  Remains on colchicine on discharge.     Fever/transient hypotension: Resolved-BP now stable-suspect she had relative adrenal insufficiency that caused hypotension (abruptly stopped steroids after being on it for 5 weeks)-wonder if the fever is from a viral syndrome that possibly could be causing her to have myocarditis..  Thankfully MRI of the liver does not show an abscess.  Blood cultures negative so far.  She has been stable without fever for the past 2 days.  She was monitored off antimicrobial therapy.   Inflammatory bowel disease: Appears to be stable-unfortunately she has been lost to follow-up-and has been taking steroids intermittently whenever she has flares by going to the ED.  Per patient-she has been on prednisone consistently since 7/16-and stopped a few days prior to this hospitalization when she started feeling ill.  She has a upcoming appointment with gastroenterology in a few weeks to reestablish care.  She was evaluated by gastroenterology in the hospital-started on [place] is to slowly taper off steroids.  She was extensively counseled regarding the importance of keeping her gastroenterology appointment-and trying to stay on a maintenance medication for IBD that ideally  does not involve her to be on steroids.    Focal nodular hyperplasia: Seen on MRI-radiology recommends repeat MRI every 6 months.  This recommendation was explained to the patient in detail.   Microcytic anemia:  Consistent with iron deficiency-unclear whether this is related to GI loss from colitis or she has menorrhagia.  Plan is to follow CBC.     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  2.)  Please follow-up with gastroenterology as previously scheduled.  3.)  You will need a repeat MRI with and without contrast in 6 months to evaluate your liver lesion (focal nodular hyperplasia).  Please discuss with your primary care practitioner at next visit.   Follow with Primary MD  Burgin, Caffie Damme, MD in 1-2 weeks  Please get a complete blood count and chemistry panel checked by your Primary MD at your next visit, and again as instructed by your Primary MD.  Get Medicines reviewed and adjusted: Please take all your medications with you for your next visit with your Primary MD  Laboratory/radiological data: Please request your Primary MD to go over all hospital tests and procedure/radiological results at the follow up, please ask your Primary MD to get all Hospital records sent to his/her office.  In some cases, they will be blood work, cultures and biopsy results pending at the time of your discharge. Please request  that your primary care M.D. follows up on these results.  Also Note the following: If you experience worsening of your admission symptoms, develop shortness of breath, life threatening emergency, suicidal or homicidal thoughts you must seek medical attention immediately by calling 911 or calling your MD immediately  if symptoms less severe.  You must read complete instructions/literature along with all the possible adverse reactions/side effects for all the Medicines you take and that have been prescribed to you. Take any new Medicines after you have completely understood and accpet all the possible adverse reactions/side effects.   Do not drive when taking Pain medications or sleeping medications (Benzodaizepines)  Do not take more than prescribed Pain, Sleep and Anxiety Medications. It is not advisable to combine anxiety,sleep and pain medications without talking with your primary care practitioner  Special Instructions: If you have smoked or chewed Tobacco  in the last 2 yrs please stop smoking, stop any regular Alcohol  and or any Recreational drug use.  Wear Seat belts while driving.  Please note: You were cared for by a hospitalist during your hospital stay. Once you are discharged, your primary care physician will handle any further medical issues. Please note that NO REFILLS for any discharge medications  will be authorized once you are discharged, as it is imperative that you return to your primary care physician (or establish a relationship with a primary care physician if you do not have one) for your post hospital discharge needs so that they can reassess your need for medications and monitor your lab values.   Increase activity slowly   Complete by: As directed       Allergies as of 11/17/2020       Reactions   Hydrocodone-acetaminophen Nausea And Vomiting   Morphine And Related Itching        Medication List     STOP taking these medications    ciprofloxacin 500 MG  tablet Commonly known as: CIPRO   nirmatrelvir/ritonavir EUA 20 x 150 MG & 10 x 100MG Tabs Commonly known as: PAXLOVID   ondansetron 4 MG disintegrating tablet Commonly known as: Zofran ODT   oxyCODONE-acetaminophen 5-325 MG tablet Commonly known as: Percocet       TAKE these medications    colchicine 0.6 MG tablet Take 1 tablet (0.6 mg total) by mouth 2 (two) times daily.   mesalamine 1.2 g EC tablet Commonly known as: LIALDA Take 2 tablets (2.4 g total) by mouth daily with breakfast.   pantoprazole 40 MG tablet Commonly known as: PROTONIX Take 1 tablet (40 mg total) by mouth daily at 12 noon.   predniSONE 10 MG tablet Commonly known as: DELTASONE Take 1 tablet (10 mg total) by mouth See admin instructions. 2 tabs po daily x 10 days, then 1 tabs x 10 days, then 1/2 tabs x 10 days What changed:  medication strength how much to take how to take this when to take this additional instructions        Follow-up Information     Deberah Pelton, NP Follow up on 12/01/2020.   Specialty: Cardiology Why: Please arrive at 8:30 am for 8:45 am appointment in the [facility]. Contact information: [address] [place] [address] [phone]         Quentin Angst, MD. Schedule an appointment as soon as possible for a visit in 1 week(s).   Specialty: Obstetrics and Gynecology Contact information: [address] [place] [address] [phone]                Allergies  Allergen Reactions   Hydrocodone-Acetaminophen Nausea And Vomiting   Morphine And Related Itching    Consultations:  cardiology and GI   Other Procedures/Studies: DG Chest 2 View  Result Date: 11/14/2020 CLINICAL DATA:  25 year old female with history of chest pain. Pain radiating into the back and down into the right wrist. Pain worsens with movement. EXAM: CHEST - 2 VIEW COMPARISON:  Chest x-ray 03/06/2016. FINDINGS: Lung volumes are normal.  No consolidative airspace disease. No pleural effusions. No pneumothorax. No pulmonary nodule or mass noted. Pulmonary vasculature and the cardiomediastinal silhouette are within normal limits. IMPRESSION: No radiographic evidence of acute cardiopulmonary disease. Electronically Signed   By: Vinnie Langton M.D.   On: 11/14/2020 05:51   CT Angio Chest PE W and/or Wo Contrast  Result Date: 11/14/2020 CLINICAL DATA:  25 year old female with sharp midline chest pain for the past 2 days with radiation to the back and right wrist. Positive D-dimer. Evaluate for pulmonary embolism. EXAM: CT ANGIOGRAPHY CHEST WITH CONTRAST TECHNIQUE: Multidetector CT imaging of the chest was performed using the standard protocol during bolus administration of intravenous contrast. Multiplanar CT image reconstructions and MIPs  were obtained to evaluate the vascular anatomy. CONTRAST:  66m OMNIPAQUE IOHEXOL 350 MG/ML SOLN COMPARISON:  No priors. FINDINGS: Cardiovascular: There are no filling defects within the pulmonary arterial tree to suggest underlying pulmonary embolism. Heart size is normal. There is no significant pericardial fluid, thickening or pericardial calcification. No atherosclerotic calcifications are noted in the thoracic aorta or the coronary arteries. Mediastinum/Nodes: No pathologically enlarged mediastinal or hilar lymph nodes. Esophagus is unremarkable in appearance. No axillary lymphadenopathy. Lungs/Pleura: Minimal subsegmental atelectasis noted in the left lower lobe. No acute consolidative airspace disease. No pleural effusions. No suspicious appearing pulmonary nodules or masses are noted. Upper Abdomen: In segment 4A of the liver (axial image 72 of series 4 and coronal image 84 of series 7) there is a 3.5 x 2.5 x 2.4 cm heterogeneously enhancing lesion which is incompletely characterized on today's arterial phase examination. Musculoskeletal: There are no aggressive appearing lytic or blastic lesions noted in  the visualized portions of the skeleton. Review of the MIP images confirms the above findings. IMPRESSION: 1. No evidence of pulmonary embolism. 2. No acute findings to account for the patient's symptoms. 3. Large heterogeneously enhancing lesion in segment 4A of the liver, incompletely characterized on today's arterial phase examination. Further characterization with nonemergent abdominal MRI with and without IV gadolinium (preferably Eovist) is recommended in the near future. Electronically Signed   By: DVinnie LangtonM.D.   On: 11/14/2020 06:43   MR LIVER W WO CONTRAST  Result Date: 11/15/2020 CLINICAL DATA:  Inpatient. Fever and hypotension. Inflammatory bowel disease diagnosed in 2016, lost to follow-up. Indeterminate left liver mass on chest CT angiogram from yesterday, concern for abscess. Abdominal abscess/infection suspected EXAM: MRI ABDOMEN WITHOUT AND WITH CONTRAST TECHNIQUE: Multiplanar multisequence MR imaging of the abdomen was performed both before and after the administration of intravenous contrast. CONTRAST:  79mGADAVIST GADOBUTROL 1 MMOL/ML IV SOLN COMPARISON:  11/14/2020 chest CT angiogram. 10/07/2020 CT abdomen/pelvis. FINDINGS: Lower chest: Hypoventilatory changes at the dependent lung bases. Hepatobiliary: Normal liver size and configuration. No hepatic steatosis. 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 No additional liver masses. Normal gallbladder with no cholelithiasis. No biliary ductal dilatation. Common bile duct diameter 3 mm. No choledocholithiasis. No biliary masses, strictures or beading. Pancreas: No pancreatic mass or duct dilation.  No pancreas divisum. Spleen: Normal  size. No mass. Adrenals/Urinary Tract: Normal adrenals. No hydronephrosis. Normal kidneys with no renal mass. Stomach/Bowel: Normal non-distended stomach. Normal caliber visualized small and large bowel loops. Mild generalized wall thickening and hyperenhancement in the visualized left colon without evidence of acute pericolonic inflammatory changes. Vascular/Lymphatic: Normal caliber abdominal aorta. Patent portal, splenic, hepatic and renal veins. No pathologically enlarged lymph nodes in the abdomen. Other: No abdominal ascites or focal fluid collection. Musculoskeletal: No aggressive appearing focal osseous lesions. IMPRESSION: 1. Hypervascular segment 4A left liver mass measuring 3.5 x 2.9 cm with MRI features most compatible with focal nodular [place] abdomen without and with IV contrast recommended in 6 months, to be performed with EOVIST IV contrast. No evidence of a liver abscess. 2. Mild generalized wall thickening and hyperenhancement in the visualized left colon without evidence of acute pericolonic inflammatory changes, compatible with known inflammatory bowel disease. Electronically Signed  By: Ilona Sorrel M.D.   On: 11/15/2020 17:02   DG Chest Port 1 View  Result Date: 11/15/2020 CLINICAL DATA:  Sepsis. EXAM: PORTABLE CHEST 1 VIEW COMPARISON:  Chest radiograph dated 11/14/2020 and CT dated 11/14/2020 FINDINGS: The heart size and mediastinal contours are within normal limits. Both lungs are clear. The visualized skeletal structures are unremarkable. IMPRESSION: No active disease. Electronically Signed   By: Anner Crete M.D.   On: 11/15/2020 01:34   MR CARDIAC MORPHOLOGY W WO CONTRAST  Result Date: 11/16/2020 CLINICAL DATA:  Clinical question of myocarditis Study assumes HCT of 29 and BSA of 1.78 EXAM: CARDIAC MRI TECHNIQUE: The patient was scanned on a 1.5 Tesla GE magnet. A dedicated cardiac coil was used. Functional imaging was done using Fiesta sequences. 2,3,  and 4 chamber views were done to assess for RWMA's. Modified Simpson's rule using a short axis stack was used to calculate an ejection fraction on a dedicated work Conservation officer, nature. The patient received 7 cc of Gadavist. After 10 minutes inversion recovery sequences were used to assess for infiltration and scar tissue. CONTRAST:  7 cc  of Gadavist FINDINGS: 1. Mild increase in left ventricular size, with LVEDD 51 mm, but LVEDVi 84 mL/m2. Normal left ventricular thickness, with intraventricular septal thickness of 6 mm, posterior wall thickness of 5 mm, and a myocardial mass index of 44 g/m2. Mild left ventricular systolic dysfunction (LVEF =41%). There is global hypokinesis. Left ventricular parametric mapping notable for anteroseptal to inferior elevation in ECV signal (41% inferior apex, 40% inferior mid, 39% inferior base) and increase in T2 signal (anterior apex 59 ms, lateral apex 60 ms, inferoseptal mid 56 ms inferior base 59 ms). There is no late gadolinium enhancement in the left ventricular myocardium. 2. Normal right ventricular size with RVEDVI 58 mL/m2. Normal right ventricular thickness. Normal right ventricular systolic dysfunction (RVEF =50%). There are no regional wall motion abnormalities or aneurysms. 3.  Normal left and right atrial size. 4. Normal size of the aortic root, ascending aorta and pulmonary artery. 5.  No significant valvular abnormalities. Mild prolapse of the anterior mitral leaflet noted. 6. No pericardial thickening on dark blood imaging. No significant pericardial effusion. No pericardial enhancement. 7. Grossly, no extracardiac findings. Recommended dedicated study if concerned for non-cardiac pathology. IMPRESSION: Mild decrease in left ventricular function with mild increase in indexed left ventricular size. Modified [facility] Criteria met for CMR diagnosis of myocarditis. Rudean Haskell MD Electronically Signed   By: Rudean Haskell M.D.   On:  11/16/2020 15:47   ECHOCARDIOGRAM COMPLETE  Result Date: 11/14/2020    ECHOCARDIOGRAM REPORT   Patient Name:   Robin Fuller Date of Exam: 11/14/2020 Medical Rec #:  [id number]               Height:       65.0 in Accession #:    [id number]              Weight:       153.0 lb Date of Birth:  February 16, 1996                BSA:          1.765 m Patient Age:    25 years                BP:           100/69 mmHg Patient Gender: F  HR:           116 bpm. Exam Location:  Inpatient Procedure: 2D Echo, 3D Echo, Cardiac Doppler and Color Doppler STAT ECHO Indications:    Elevated Troponin  History:        Patient has no prior history of Echocardiogram examinations.                 Arrythmias:Tachycardia; Signs/Symptoms:Chest Pain. Ulcerative                 collitis. H/O Covid 19. Suspected myocarditis/pericarditis.                 Chest pain for two days.  Sonographer:    Merrie Roof RDCS Referring Phys: [id number] [place]  1. Left ventricular ejection fraction, by estimation, is 60 to 65%. Left ventricular ejection fraction by 3D volume is 57 %. The left ventricle has normal function. The left ventricle has no regional wall motion abnormalities. Left ventricular diastolic  parameters were normal.  2. Right ventricular systolic function is normal. The right ventricular size is normal.  3. The mitral valve is normal in structure. No evidence of mitral valve regurgitation. No evidence of mitral stenosis.  4. The aortic valve is normal in structure. Aortic valve regurgitation is not visualized. No aortic stenosis is present.  5. The inferior vena cava is normal in size with greater than 50% respiratory variability, suggesting right atrial pressure of 3 mmHg. FINDINGS  Left Ventricle: Left ventricular ejection fraction, by estimation, is 60 to 65%. Left ventricular ejection fraction by 3D volume is 57 %. The left ventricle has normal function. The left ventricle has no regional wall  motion abnormalities. The left ventricular internal cavity size was normal in size. There is no left ventricular hypertrophy. Left ventricular diastolic parameters were normal. Normal left ventricular filling pressure. Right Ventricle: The right ventricular size is normal. No increase in right ventricular wall thickness. Right ventricular systolic function is normal. Left Atrium: Left atrial size was normal in size. Right Atrium: Right atrial size was normal in size. Pericardium: There is no evidence of pericardial effusion. Mitral Valve: The mitral valve is normal in structure. No evidence of mitral valve regurgitation. No evidence of mitral valve stenosis. Tricuspid Valve: The tricuspid valve is normal in structure. Tricuspid valve regurgitation is not demonstrated. No evidence of tricuspid stenosis. Aortic Valve: The aortic valve is normal in structure. Aortic valve regurgitation is not visualized. No aortic stenosis is present. Aortic valve mean gradient measures 5.0 mmHg. Aortic valve peak gradient measures 8.8 mmHg. Aortic valve area, by VTI measures 2.31 cm. Pulmonic Valve: The pulmonic valve was normal in structure. Pulmonic valve regurgitation is not visualized. No evidence of pulmonic stenosis. Aorta: The aortic root is normal in size and structure. Venous: The inferior vena cava is normal in size with greater than 50% respiratory variability, suggesting right atrial pressure of 3 mmHg. IAS/Shunts: No atrial level shunt detected by color flow Doppler.  LEFT VENTRICLE PLAX 2D LVIDd:         4.60 cm         Diastology LVIDs:         3.40 cm         LV e' medial:  12.30 cm/s LV PW:         0.90 cm         LV e' lateral: 12.90 cm/s LV IVS:        0.70 cm LVOT diam:  1.90 cm LV SV:         50              3D Volume EF LV SV Index:   28              LV 3D EF:    Left LVOT Area:     2.84 cm                     ventricul                                             ar                                              ejection                                             fraction                                             by 3D                                             volume is                                             57 %.                                 3D Volume EF:                                3D EF:        57 %                                LV EDV:       90 ml                                LV ESV:       39 ml                                LV SV:        51 ml RIGHT VENTRICLE RV Basal diam:  2.20 cm RV S prime:     12.10 cm/s TAPSE (M-mode): 1.6 cm LEFT ATRIUM             Index  RIGHT ATRIUM           Index LA diam:        3.00 cm 1.70 cm/m  RA Area:     15.70 cm LA Vol (A2C):   20.6 ml 11.67 ml/m RA Volume:   35.30 ml  20.00 ml/m LA Vol (A4C):   31.1 ml 17.62 ml/m LA Biplane Vol: 25.5 ml 14.45 ml/m  AORTIC VALVE AV Area (Vmax):    2.22 cm AV Area (Vmean):   2.26 cm AV Area (VTI):     2.31 cm AV Vmax:           148.00 cm/s AV Vmean:          104.000 cm/s AV VTI:            0.215 m AV Peak Grad:      8.8 mmHg AV Mean Grad:      5.0 mmHg LVOT Vmax:         116.00 cm/s LVOT Vmean:        83.000 cm/s LVOT VTI:          0.175 m LVOT/AV VTI ratio: 0.81  AORTA Ao Root diam: 2.40 cm Ao Asc diam:  1.90 cm  SHUNTS Systemic VTI:  0.18 m Systemic Diam: 1.90 cm Dani Gobble Croitoru MD Electronically signed by Sanda Klein MD Signature Date/Time: 11/14/2020/5:26:55 PM    Final      TODAY-DAY OF DISCHARGE:  Subjective:   Robin Fuller today has no headache,no chest abdominal pain,no new weakness tingling or numbness, feels much better wants to go home today.   Objective:   Blood pressure 92/63, pulse 98, temperature 98.7 F (37.1 C), temperature source Oral, resp. rate 18, height 5' 5" (1.651 m), weight 69.8 kg, SpO2 100 %.  Intake/Output Summary (Last 24 hours) at 11/17/2020 1100 Last data filed at 11/17/2020 0903 Gross per 24 hour  Intake 460 ml  Output --  Net 460 ml   Filed Weights    11/15/20 0148 11/16/20 0430 11/17/20 0322  Weight: 70.4 kg 69.4 kg 69.8 kg    Exam: Awake Alert, Oriented *3, No new F.N deficits, Normal affect [place].AT,PERRAL Supple Neck,No JVD, No cervical lymphadenopathy appriciated.  Symmetrical Chest wall movement, Good air movement bilaterally, CTAB RRR,No Gallops,Rubs or new Murmurs, No Parasternal Heave +ve B.Sounds, Abd Soft, Non tender, No organomegaly appriciated, No rebound -guarding or rigidity. No Cyanosis, Clubbing or edema, No new Rash or bruise   PERTINENT RADIOLOGIC STUDIES: MR LIVER W WO CONTRAST  Result Date: 11/15/2020 CLINICAL DATA:  Inpatient. Fever and hypotension. Inflammatory bowel disease diagnosed in 2016, lost to follow-up. Indeterminate left liver mass on chest CT angiogram from yesterday, concern for abscess. Abdominal abscess/infection suspected EXAM: MRI ABDOMEN WITHOUT AND WITH CONTRAST TECHNIQUE: Multiplanar multisequence MR imaging of the abdomen was performed both before and after the administration of intravenous contrast. CONTRAST:  25m GADAVIST GADOBUTROL 1 MMOL/ML IV SOLN COMPARISON:  11/14/2020 chest CT angiogram. 10/07/2020 CT abdomen/pelvis. FINDINGS: Lower chest: Hypoventilatory changes at the dependent lung bases. Hepatobiliary: Normal liver size and configuration. No hepatic steatosis. 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 No additional liver masses. Normal gallbladder with no cholelithiasis. No biliary ductal dilatation. Common bile duct diameter 3  mm. No choledocholithiasis. No biliary masses, strictures or beading. Pancreas: No pancreatic mass or duct dilation.  No pancreas divisum. Spleen: Normal size. No mass.  Adrenals/Urinary Tract: Normal adrenals. No hydronephrosis. Normal kidneys with no renal mass. Stomach/Bowel: Normal non-distended stomach. Normal caliber visualized small and large bowel loops. Mild generalized wall thickening and hyperenhancement in the visualized left colon without evidence of acute pericolonic inflammatory changes. Vascular/Lymphatic: Normal caliber abdominal aorta. Patent portal, splenic, hepatic and renal veins. No pathologically enlarged lymph nodes in the abdomen. Other: No abdominal ascites or focal fluid collection. Musculoskeletal: No aggressive appearing focal osseous lesions. IMPRESSION: 1. Hypervascular segment 4A left liver mass measuring 3.5 x 2.9 cm with MRI features most compatible with focal nodular hyperplasia ([place]). Follow-up MRI abdomen without and with IV contrast recommended in 6 months, to be performed with EOVIST IV contrast. No evidence of a liver abscess. 2. Mild generalized wall thickening and hyperenhancement in the visualized left colon without evidence of acute pericolonic inflammatory changes, compatible with known inflammatory bowel disease. Electronically Signed   By: Ilona Sorrel M.D.   On: 11/15/2020 17:02   MR CARDIAC MORPHOLOGY W WO CONTRAST  Result Date: 11/16/2020 CLINICAL DATA:  Clinical question of myocarditis Study assumes HCT of 29 and BSA of 1.78 EXAM: CARDIAC MRI TECHNIQUE: The patient was scanned on a 1.5 Tesla GE magnet. A dedicated cardiac coil was used. Functional imaging was done using Fiesta sequences. 2,3, and 4 chamber views were done to assess for RWMA's. Modified Simpson's rule using a short axis stack was used to calculate an ejection fraction on a dedicated work Conservation officer, nature. The patient received 7 cc of Gadavist. After 10 minutes inversion recovery sequences were used to assess for infiltration and scar tissue. CONTRAST:  7 cc  of Gadavist FINDINGS: 1. Mild increase in left ventricular size, with LVEDD 51 mm, but LVEDVi  84 mL/m2. Normal left ventricular thickness, with intraventricular septal thickness of 6 mm, posterior wall thickness of 5 mm, and a myocardial mass index of 44 g/m2. Mild left ventricular systolic dysfunction (LVEF =41%). There is global hypokinesis. Left ventricular parametric mapping notable for anteroseptal to inferior elevation in ECV signal (41% inferior apex, 40% inferior mid, 39% inferior base) and increase in T2 signal (anterior apex 59 ms, lateral apex 60 ms, inferoseptal mid 56 ms inferior base 59 ms). There is no late gadolinium enhancement in the left ventricular myocardium. 2. Normal right ventricular size with RVEDVI 58 mL/m2. Normal right ventricular thickness. Normal right ventricular systolic dysfunction (RVEF =50%). There are no regional wall motion abnormalities or aneurysms. 3.  Normal left and right atrial size. 4. Normal size of the aortic root, ascending aorta and pulmonary artery. 5.  No significant valvular abnormalities. Mild prolapse of the anterior mitral leaflet noted. 6. No pericardial thickening on dark blood imaging. No significant pericardial effusion. No pericardial enhancement. 7. Grossly, no extracardiac findings. Recommended dedicated study if concerned for non-cardiac pathology. IMPRESSION: Mild decrease in left ventricular function with mild increase in indexed left ventricular size. Modified [facility] Criteria met for CMR diagnosis of myocarditis. Rudean Haskell MD Electronically Signed   By: Rudean Haskell M.D.   On: 11/16/2020 15:47     PERTINENT LAB RESULTS: CBC: Recent Labs    11/15/20 0122 11/16/20 0111  WBC 6.4 5.4  HGB 8.8* 8.8*  HCT 28.9* 29.4*  PLT 529* 587*   CMET CMP     Component Value Date/Time   NA 136 11/16/2020 0111  K 3.9 11/16/2020 0111   CL 107 11/16/2020 0111   CO2 22 11/16/2020 0111   GLUCOSE 137 (H) 11/16/2020 0111   BUN <5 (L) 11/16/2020 0111   CREATININE 0.61 11/16/2020 0111   CALCIUM 8.3 (L) 11/16/2020 0111    PROT 5.4 (L) 11/16/2020 0111   ALBUMIN 2.1 (L) 11/16/2020 0111   AST 26 11/16/2020 0111   ALT 31 11/16/2020 0111   ALKPHOS 55 11/16/2020 0111   BILITOT 0.5 11/16/2020 0111   GFRNONAA >60 11/16/2020 0111   GFRAA NOT CALCULATED 07/07/2014 0110    GFR Estimated Creatinine Clearance: 106.3 mL/min (by C-G formula based on SCr of 0.61 mg/dL). No results for input(s): LIPASE, AMYLASE in the last 72 hours. No results for input(s): CKTOTAL, CKMB, CKMBINDEX, TROPONINI in the last 72 hours. Invalid input(s): POCBNP No results for input(s): DDIMER in the last 72 hours. No results for input(s): HGBA1C in the last 72 hours. No results for input(s): CHOL, HDL, LDLCALC, TRIG, CHOLHDL, LDLDIRECT in the last 72 hours. Recent Labs    11/14/20 1632  TSH 0.644   Recent Labs    11/14/20 1632  FERRITIN 48  TIBC 270  IRON 9*   Coags: Recent Labs    11/15/20 0136  INR 1.1   Microbiology: Recent Results (from the past 240 hour(s))  Resp Panel by RT-PCR (Flu A&B, Covid) Nasopharyngeal Swab     Status: None   Collection Time: 11/14/20  6:37 AM   Specimen: Nasopharyngeal Swab; Nasopharyngeal(NP) swabs in vial transport medium  Result Value Ref Range Status   SARS Coronavirus 2 by RT PCR NEGATIVE NEGATIVE Final    Comment: (NOTE) SARS-CoV-2 target nucleic acids are NOT DETECTED.  The SARS-CoV-2 RNA is generally detectable in upper respiratory specimens during the acute phase of infection. The lowest concentration of SARS-CoV-2 viral copies this assay can detect is 138 copies/mL. A negative result does not preclude SARS-Cov-2 infection and should not be used as the sole basis for treatment or other patient management decisions. A negative result may occur with  improper specimen collection/handling, submission of specimen other than nasopharyngeal swab, presence of viral mutation(s) within the areas targeted by this assay, and inadequate number of viral copies(<138 copies/mL). A negative  result must be combined with clinical observations, patient history, and epidemiological information. The expected result is Negative.  Fact Sheet for Patients:  EntrepreneurPulse.com.au  Fact Sheet for Healthcare Providers:  IncredibleEmployment.be  This test is no t yet approved or cleared by the [place] FDA and  has been authorized for detection and/or diagnosis of SARS-CoV-2 by FDA under an Emergency Use Authorization (EUA). This EUA will remain  in effect (meaning this test can be used) for the duration of the COVID-19 declaration under Section 564(b)(1) of the Act, 21 U.S.C.section 360bbb-3(b)(1), unless the authorization is terminated  or revoked sooner.       Influenza A by PCR NEGATIVE NEGATIVE Final   Influenza B by PCR NEGATIVE NEGATIVE Final    Comment: (NOTE) The Xpert Xpress SARS-CoV-2/FLU/RSV plus assay is intended as an aid in the diagnosis of influenza from Nasopharyngeal swab specimens and should not be used as a sole basis for treatment. Nasal washings and aspirates are unacceptable for Xpert Xpress SARS-CoV-2/FLU/RSV testing.  Fact Sheet for Patients: EntrepreneurPulse.com.au  Fact Sheet for Healthcare Providers: IncredibleEmployment.be  This test is not yet approved or cleared by the [place] FDA and has been authorized for detection and/or diagnosis of SARS-CoV-2 by FDA under an Emergency Use Authorization (  EUA). This EUA will remain in effect (meaning this test can be used) for the duration of the COVID-19 declaration under Section 564(b)(1) of the Act, 21 U.S.C. section 360bbb-3(b)(1), unless the authorization is terminated or revoked.  Performed at [facility], [address] [place], [place] [address]   Culture, blood (routine x 2)     Status: None (Preliminary result)   Collection Time: 11/15/20  1:18 AM   Specimen: BLOOD  Result Value Ref Range  Status   Specimen Description BLOOD UPPER RIGHT HAND  Final   Special Requests   Final    BOTTLES DRAWN AEROBIC ONLY Blood Culture adequate volume   Culture   Final    NO GROWTH 2 DAYS Performed at [facility], [place] [address], [place], [place] [address]    Report Status PENDING  Incomplete  Culture, blood (routine x 2)     Status: None (Preliminary result)   Collection Time: 11/15/20  1:20 AM   Specimen: BLOOD  Result Value Ref Range Status   Specimen Description BLOOD LOWER RIGHT HAND  Final   Special Requests   Final    BOTTLES DRAWN AEROBIC ONLY Blood Culture adequate volume   Culture   Final    NO GROWTH 2 DAYS Performed at [facility], [place] [address], [place], [place] [address]    Report Status PENDING  Incomplete    FURTHER DISCHARGE INSTRUCTIONS:  Get Medicines reviewed and adjusted: Please take all your medications with you for your next visit with your Primary MD  Laboratory/radiological data: Please request your Primary MD to go over all hospital tests and procedure/radiological results at the follow up, please ask your Primary MD to get all Hospital records sent to his/her office.  In some cases, they will be blood work, cultures and biopsy results pending at the time of your discharge. Please request that your primary care M.D. goes through all the records of your hospital data and follows up on these results.  Also Note the following: If you experience worsening of your admission symptoms, develop shortness of breath, life threatening emergency, suicidal or homicidal thoughts you must seek medical attention immediately by calling 911 or calling your MD immediately  if symptoms less severe.  You must read complete instructions/literature along with all the possible adverse reactions/side effects for all the Medicines you take and that have been prescribed to you. Take any new Medicines after you have completely understood and accpet all the possible  adverse reactions/side effects.   Do not drive when taking Pain medications or sleeping medications (Benzodaizepines)  Do not take more than prescribed Pain, Sleep and Anxiety Medications. It is not advisable to combine anxiety,sleep and pain medications without talking with your primary care practitioner  Special Instructions: If you have smoked or chewed Tobacco  in the last 2 yrs please stop smoking, stop any regular Alcohol  and or any Recreational drug use.  Wear Seat belts while driving.  Please note: You were cared for by a hospitalist during your hospital stay. Once you are discharged, your primary care physician will handle any further medical issues. Please note that NO REFILLS for any discharge medications will be authorized once you are discharged, as it is imperative that you return to your primary care physician (or establish a relationship with a primary care physician if you do not have one) for your post hospital discharge needs so that they can reassess your need for medications and monitor  your lab values.  Total Time spent coordinating discharge including counseling, education and face to face time equals 35 minutes.  Signed:   11/17/2020 11:00 AM

## 2020-11-17 NOTE — Progress Notes (Signed)
Progress Note  Patient Name: Robin Fuller Date of Encounter: 11/17/2020  Primary Cardiologist: New to Gasper Sells MD  Subjective   CMR consistent with myocarditis.  LVEF mildly decrease.  Low Bps but asymptomatic.  Inpatient Medications    Scheduled Meds:  colchicine  0.6 mg Oral BID   mesalamine  2.4 g Oral Q breakfast   pantoprazole  40 mg Oral Q1200   predniSONE  30 mg Oral Q breakfast   Continuous Infusions:   PRN Meds: HYDROmorphone (DILAUDID) injection, ketorolac, ondansetron   Vital Signs    Vitals:   11/16/20 0741 11/16/20 0900 11/16/20 1947 11/17/20 0322  BP: 102/73 99/61 (!) 97/58 92/63  Pulse: 98  90 98  Resp: 20  17 18   Temp: 98.4 F (36.9 C) 99 F (37.2 C) 98.2 F (36.8 C) 98.7 F (37.1 C)  TempSrc: Oral Oral Oral Oral  SpO2: 100% 100% 100% 100%  Weight:    69.8 kg  Height:        Intake/Output Summary (Last 24 hours) at 11/17/2020 0958 Last data filed at 11/17/2020 4496 Gross per 24 hour  Intake 460 ml  Output --  Net 460 ml   Filed Weights   11/15/20 0148 11/16/20 0430 11/17/20 0322  Weight: 70.4 kg 69.4 kg 69.8 kg    Telemetry    SR to sinus tachy - Personally Reviewed  ECG    No new - Personally Reviewed  Physical Exam   GEN: No acute distress.   Neck: No JVD Cardiac: regular tachycardia, no murmurs, no rubs, or gallops.  Respiratory: Clear to auscultation bilaterally. GI: Soft, nontender, non-distended  MS: No edema; No deformity. Neuro:  Nonfocal  Psych: Normal affect   Labs    Chemistry Recent Labs  Lab 11/14/20 0520 11/14/20 0922 11/15/20 0122 11/16/20 0111  NA 135  --  133* 136  K 3.5  --  3.7 3.9  CL 101  --  106 107  CO2 23  --  19* 22  GLUCOSE 148*  --  98 137*  BUN 10  --  6 <5*  CREATININE 0.87  --  0.67 0.61  CALCIUM 8.8*  --  8.0* 8.3*  PROT  --  6.4* 5.0* 5.4*  ALBUMIN  --  2.9* 2.1* 2.1*  AST  --  66* 16 26  ALT  --  27 20 31   ALKPHOS  --  66 49 55  BILITOT  --  1.8* 0.5 0.5   GFRNONAA >60  --  >60 >60  ANIONGAP 11  --  8 7     Hematology Recent Labs  Lab 11/14/20 0520 11/15/20 0122 11/16/20 0111  WBC 8.0 6.4 5.4  RBC 4.30 3.84* 3.88  HGB 9.9* 8.8* 8.8*  HCT 32.4* 28.9* 29.4*  MCV 75.3* 75.3* 75.8*  MCH 23.0* 22.9* 22.7*  MCHC 30.6 30.4 29.9*  RDW 17.4* 17.2* 17.2*  PLT 633* 529* 587*    Cardiac EnzymesNo results for input(s): TROPONINI in the last 168 hours. No results for input(s): TROPIPOC in the last 168 hours.   BNPNo results for input(s): BNP, PROBNP in the last 168 hours.   DDimer  Recent Labs  Lab 11/14/20 0520  DDIMER 1.88*     Radiology    MR LIVER W WO CONTRAST  Result Date: 11/15/2020 CLINICAL DATA:  Inpatient. Fever and hypotension. Inflammatory bowel disease diagnosed in 2016, lost to follow-up. Indeterminate left liver mass on chest CT angiogram from yesterday, concern for abscess. Abdominal abscess/infection suspected EXAM:  MRI ABDOMEN WITHOUT AND WITH CONTRAST TECHNIQUE: Multiplanar multisequence MR imaging of the abdomen was performed both before and after the administration of intravenous contrast. CONTRAST:  4m GADAVIST GADOBUTROL 1 MMOL/ML IV SOLN COMPARISON:  11/14/2020 chest CT angiogram. 10/07/2020 CT abdomen/pelvis. FINDINGS: Lower chest: Hypoventilatory changes at the dependent lung bases. Hepatobiliary: Normal liver size and configuration. No hepatic steatosis. 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 No additional liver masses. Normal gallbladder with no cholelithiasis. No biliary ductal dilatation. Common bile duct diameter 3 mm. No choledocholithiasis. No biliary masses, strictures or beading. Pancreas: No pancreatic  mass or duct dilation.  No pancreas divisum. Spleen: Normal size. No mass. Adrenals/Urinary Tract: Normal adrenals. No hydronephrosis. Normal kidneys with no renal mass. Stomach/Bowel: Normal non-distended stomach. Normal caliber visualized small and large bowel loops. Mild generalized wall thickening and hyperenhancement in the visualized left colon without evidence of acute pericolonic inflammatory changes. Vascular/Lymphatic: Normal caliber abdominal aorta. Patent portal, splenic, hepatic and renal veins. No pathologically enlarged lymph nodes in the abdomen. Other: No abdominal ascites or focal fluid collection. Musculoskeletal: No aggressive appearing focal osseous lesions. IMPRESSION: 1. Hypervascular segment 4A left liver mass measuring 3.5 x 2.9 cm with MRI features most compatible with focal nodular hyperplasia ([place]. Follow-up MRI abdomen without and with IV contrast recommended in 6 months, to be performed with EOVIST IV contrast. No evidence of a liver abscess. 2. Mild generalized wall thickening and hyperenhancement in the visualized left colon without evidence of acute pericolonic inflammatory changes, compatible with known inflammatory bowel disease. Electronically Signed   By: JIlona SorrelM.D.   On: 11/15/2020 17:02   MR CARDIAC MORPHOLOGY W WO CONTRAST  Result Date: 11/16/2020 CLINICAL DATA:  Clinical question of myocarditis Study assumes HCT of 29 and BSA of 1.78 EXAM: CARDIAC MRI TECHNIQUE: The patient was scanned on a 1.5 Tesla GE magnet. A dedicated cardiac coil was used. Functional imaging was done using Fiesta sequences. 2,3, and 4 chamber views were done to assess for RWMA's. Modified Simpson's rule using a short axis stack was used to calculate an ejection fraction on a dedicated work sConservation officer, nature The patient received 7 cc of Gadavist. After 10 minutes inversion recovery sequences were used to assess for infiltration and scar tissue. CONTRAST:  7 cc  of Gadavist  FINDINGS: 1. Mild increase in left ventricular size, with LVEDD 51 mm, but LVEDVi 84 mL/m2. Normal left ventricular thickness, with intraventricular septal thickness of 6 mm, posterior wall thickness of 5 mm, and a myocardial mass index of 44 g/m2. Mild left ventricular systolic dysfunction (LVEF =41%). There is global hypokinesis. Left ventricular parametric mapping notable for anteroseptal to inferior elevation in ECV signal (41% inferior apex, 40% inferior mid, 39% inferior base) and increase in T2 signal (anterior apex 59 ms, lateral apex 60 ms, inferoseptal mid 56 ms inferior base 59 ms). There is no late gadolinium enhancement in the left ventricular myocardium. 2. Normal right ventricular size with RVEDVI 58 mL/m2. Normal right ventricular thickness. Normal right ventricular systolic dysfunction (RVEF =50%). There are no regional wall motion abnormalities or aneurysms. 3.  Normal left and right atrial size. 4. Normal size of the aortic root, ascending aorta and pulmonary artery. 5.  No  significant valvular abnormalities. Mild prolapse of the anterior mitral leaflet noted. 6. No pericardial thickening on dark blood imaging. No significant pericardial effusion. No pericardial enhancement. 7. Grossly, no extracardiac findings. Recommended dedicated study if concerned for non-cardiac pathology. IMPRESSION: Mild decrease in left ventricular function with mild increase in indexed left ventricular size. Modified [facility] Criteria met for CMR diagnosis of myocarditis. Rudean Haskell MD Electronically Signed   By: Rudean Haskell M.D.   On: 11/16/2020 15:47     Patient Profile     25 y.o. female with history of Crohn's disease with troponin elevation, liver mass, fevers and hypotension in the setting of preserved LV function.  Assessment & Plan   Myocarditis with mildly reduced EF Crohn's Disease Microcytic anemia due to chronic blood loss Question of adrenal insufficiency on  steroids Liver adenoma - troponin has peaked - no significant pericardial effusion or LV dysfunction - on colchicine - patient had deferred NSAIDs; if residual pain would restart - ideally would start low dose BB, but unclear if she would tolerate metoprolol 12.5 mg PO BID - will plan echocardiogram as outpatient - feeling better and nearing DC  [facility] will sign off.   Medication Recommendations:  Colchicine 0.6 mg PO BID Other recommendations (labs, testing, etc):  Outpatient echo in 2-3 weeks Follow up as an outpatient:  will arrange outpatient follow up  For questions or updates, please contact Elsa Please consult [email or web address] for contact info under Cardiology/STEMI.      Signed, Werner Lean, MD  11/17/2020, 10:04 AM

## 2020-11-20 LAB — CULTURE, BLOOD (ROUTINE X 2)
Culture: NO GROWTH
Culture: NO GROWTH
Special Requests: ADEQUATE
Special Requests: ADEQUATE

## 2020-11-20 LAB — CALPROTECTIN, FECAL: Calprotectin, Fecal: 377 ug/g — ABNORMAL HIGH (ref 0–120)

## 2020-12-01 ENCOUNTER — Ambulatory Visit: Payer: Medicaid Other | Admitting: General Practice

## 2020-12-06 ENCOUNTER — Ambulatory Visit (HOSPITAL_COMMUNITY): Payer: Medicaid Other | Attending: Cardiovascular Disease

## 2020-12-07 ENCOUNTER — Encounter (HOSPITAL_COMMUNITY): Payer: Self-pay | Admitting: Physician Assistant

## 2020-12-12 ENCOUNTER — Telehealth (HOSPITAL_BASED_OUTPATIENT_CLINIC_OR_DEPARTMENT_OTHER): Payer: Self-pay | Admitting: Physician Assistant

## 2020-12-12 NOTE — Telephone Encounter (Signed)
Left message for patient to call and discuss rescheduling the Echo ordered by Rosaria Ferries, PA

## 2020-12-15 ENCOUNTER — Encounter (HOSPITAL_BASED_OUTPATIENT_CLINIC_OR_DEPARTMENT_OTHER): Payer: Self-pay | Admitting: Physician Assistant

## 2020-12-19 NOTE — Progress Notes (Deleted)
Cardiology Office Note:    Date:  12/19/2020   ID:  Robin Fuller, DOB [birth year], MRN [id number]  PCP:  Robin Angst, MD   [place] Providers Cardiologist:  None { Click to update primary MD,subspecialty MD or APP then REFRESH:1}  *** Referring MD: Robin Fuller, *   Chief Complaint:  No chief complaint on file. {Click here for Visit Info    :1}   Patient Profile:   Robin Fuller is a 25 y.o. female with:  Myocarditis with mildly reduced EF  CMR 8/22: EF 41 Echocardiogram 8/22: EF 60-65 Crohn's Disease Chronic prednisone Liver mass MRI 8/22: focal nodular hyperplasia >> needs f/u in 6 mos Microcytic anemia  Prior CV studies: MR CARDIAC MORPHOLOGY W WO CONTRAST 11/16/2020 Narrative FINDINGS: 1. Mild increase in left ventricular size, with LVEDD 51 mm, but LVEDVi 84 mL/m2.  Normal left ventricular thickness, with intraventricular septal thickness of 6 mm, posterior wall thickness of 5 mm, and a myocardial mass index of 44 g/m2.  Mild left ventricular systolic dysfunction (LVEF =41%). There is global hypokinesis.  Left ventricular parametric mapping notable for anteroseptal to inferior elevation in ECV signal (41% inferior apex, 40% inferior mid, 39% inferior base) and increase in T2 signal (anterior apex 59 ms, lateral apex 60 ms, inferoseptal mid 56 ms inferior base 59 ms).  There is no late gadolinium enhancement in the left ventricular myocardium.  2. Normal right ventricular size with RVEDVI 58 mL/m2.  Normal right ventricular thickness.  Normal right ventricular systolic dysfunction (RVEF =50%). There are no regional wall motion abnormalities or aneurysms.  3.  Normal left and right atrial size.  4. Normal size of the aortic root, ascending aorta and pulmonary artery.  5.  No significant valvular abnormalities.  Mild prolapse of the anterior mitral leaflet noted.  6. No pericardial thickening on dark blood  imaging. No significant pericardial effusion. No pericardial enhancement.  7. Grossly, no extracardiac findings. Recommended dedicated study if concerned for non-cardiac pathology.  IMPRESSION: Mild decrease in left ventricular function with mild increase in indexed left ventricular size. Modified [facility] Criteria met for CMR diagnosis of myocarditis.   ECHOCARDIOGRAM 11/14/20  1. Left ventricular ejection fraction, by estimation, is 60 to 65%. Left  ventricular ejection fraction by 3D volume is 57 %. The left ventricle has  normal function. The left ventricle has no regional wall motion  abnormalities. Left ventricular diastolic   parameters were normal.   2. Right ventricular systolic function is normal. The right ventricular  size is normal.   3. The mitral valve is normal in structure. No evidence of mitral valve  regurgitation. No evidence of mitral stenosis.   4. The aortic valve is normal in structure. Aortic valve regurgitation is  not visualized. No aortic stenosis is present.   5. The inferior vena cava is normal in size with greater than 50%  respiratory variability, suggesting right atrial pressure of 3 mmHg.   {Select studies to display:26339}   History of Present Illness: [name] was admitted 8/23-8/26 with myocarditis.   Her hs-Trop peaked at 553.  She had stopped her chronic prednisone a few days prior to admission.  She did have issues with hypotension during her admission.  She was followed by Dr. Gasper Sells.  Her EF was normal on echocardiogram but mildly reduced on MRI (EF 41).  MRI findings were c/w myocarditis.  There was no pericardial effusion.  She was tx with Colchicine.  She declined NSAIDs due to  a hx of aggravating her IBD.  Ideally, she should have been placed on beta-blocker but her BP was too low.  This can be considered as an OP if BP will allow.  She will also need a f/u echocardiogram.  She was seen by GI and placed on Lialda with  plans to eventually taper her off of prednisone.  Chest CT suggested a liver mass and a f/u abdominal MRI demonstrated focal nodular hyperplasia.  She will need a repeat in 6 mos.   She returns for f/u.  ***     Past Medical History:  Diagnosis Date   Crohn's disease ([place])    Frequent headaches    Migraine    Ulcerative colitis ([place])    Current Medications: No outpatient medications have been marked as taking for the 12/20/20 encounter (Appointment) with Richardson Dopp T, PA-C.    Allergies:   Hydrocodone-acetaminophen and Morphine and related   Social History   Tobacco Use   Smoking status: Never   Smokeless tobacco: Never  Vaping Use   Vaping Use: Never used  Substance Use Topics   Alcohol use: No   Drug use: No    Family Hx: The patient's family history is not on file.  ROS   EKGs/Labs/Other Test Reviewed:    EKG:  EKG is *** ordered today.  The ekg ordered today demonstrates ***  Recent Labs: 11/14/2020: TSH 0.644 11/16/2020: ALT 31; BUN <5; Creatinine, Ser 0.61; Hemoglobin 8.8; Platelets 587; Potassium 3.9; Sodium 136   Recent Lipid Panel No results found for: CHOL, TRIG, HDL, LDLCALC, LDLDIRECT   Risk Assessment/Calculations:   {Does this patient have ATRIAL FIBRILLATION?:[phone]}      Physical Exam:    VS:  There were no vitals taken for this visit.    Wt Readings from Last 3 Encounters:  11/17/20 153 lb 14.4 oz (69.8 kg)  10/25/20 160 lb (72.6 kg)  10/07/20 150 lb (68 kg)    Physical Exam ***     ASSESSMENT & PLAN:   {Select for Dx:25819}    {Are you ordering a CV Procedure (e.g. stress test, cath, DCCV, TEE, etc)?   Press F2        :[id number]}   Dispo:  No follow-ups on file.   Medication Adjustments/Labs and Tests Ordered: Current medicines are reviewed at length with the patient today.  Concerns regarding medicines are outlined above.  Tests Ordered: No orders of the defined types were placed in this encounter.  Medication Changes: No  orders of the defined types were placed in this encounter.  Signed, Richardson Dopp, PA-C  12/19/2020 9:40 PM    [facility] [address], [place], [place]  [address] Phone: [phone]; Fax: [phone]

## 2020-12-20 ENCOUNTER — Telehealth (HOSPITAL_COMMUNITY): Payer: Self-pay | Admitting: Physician Assistant

## 2020-12-20 ENCOUNTER — Ambulatory Visit: Payer: Medicaid Other | Admitting: Physician Assistant

## 2020-12-20 DIAGNOSIS — I400 Infective myocarditis: Secondary | ICD-10-CM

## 2020-12-20 DIAGNOSIS — D508 Other iron deficiency anemias: Secondary | ICD-10-CM

## 2020-12-20 DIAGNOSIS — R932 Abnormal findings on diagnostic imaging of liver and biliary tract: Secondary | ICD-10-CM

## 2020-12-20 DIAGNOSIS — K50919 Crohn's disease, unspecified, with unspecified complications: Secondary | ICD-10-CM

## 2020-12-20 NOTE — Telephone Encounter (Signed)
Noted .Adonis Housekeeper

## 2020-12-20 NOTE — Telephone Encounter (Signed)
Just an FYI. We have made several attempts to contact this patient including sending a letter to schedule or reschedule their echocardiogram. We will be removing the patient from the echo WQ.  12/06/20 NO SHOWED-MAILED LETTER [facility]  12/15/20 called to discuss rescheduling Echo---call could not be completed---will mail letter to patient requesting she call kf    Thank you

## 2021-03-14 ENCOUNTER — Encounter (HOSPITAL_BASED_OUTPATIENT_CLINIC_OR_DEPARTMENT_OTHER): Payer: Self-pay | Admitting: Emergency Medicine

## 2021-03-14 ENCOUNTER — Emergency Department (HOSPITAL_BASED_OUTPATIENT_CLINIC_OR_DEPARTMENT_OTHER)
Admission: EM | Admit: 2021-03-14 | Discharge: 2021-03-14 | Disposition: A | Discharge status: Home / Self Care | Payer: Medicaid Other | Attending: Emergency Medicine | Admitting: Emergency Medicine

## 2021-03-14 ENCOUNTER — Other Ambulatory Visit: Payer: Self-pay

## 2021-03-14 DIAGNOSIS — R21 Rash and other nonspecific skin eruption: Secondary | ICD-10-CM | POA: Insufficient documentation

## 2021-03-14 MED ORDER — PREDNISONE 50 MG PO TABS
60.0000 mg | ORAL_TABLET | Freq: Once | ORAL | Status: AC
  Administered 2021-03-14: 07:00:00 60 mg via ORAL
  Filled 2021-03-14: qty 1

## 2021-03-14 MED ORDER — PREDNISONE 20 MG PO TABS: 60.0000 mg | ORAL_TABLET | Freq: Every day | ORAL | 0 refills | Status: AC

## 2021-03-14 MED ORDER — ACETAMINOPHEN 325 MG PO TABS
650.0000 mg | ORAL_TABLET | Freq: Once | ORAL | Status: DC
  Filled 2021-03-14: qty 2

## 2021-03-14 MED ORDER — DIPHENHYDRAMINE HCL 25 MG PO TABS: 25.0000 mg | ORAL_TABLET | Freq: Four times a day (QID) | ORAL | 0 refills | Status: AC | PRN

## 2021-03-14 NOTE — Discharge Instructions (Addendum)
Take next dose of prednisone tomorrow.  Take Benadryl as needed.

## 2021-03-14 NOTE — ED Provider Notes (Signed)
[facility] EMERGENCY DEPARTMENT Provider Note   CSN: [id number] Arrival date & time: 03/14/21  6734     History Chief Complaint  Patient presents with   Rash    Robin Fuller is a 25 y.o. female.  The history is provided by the patient.  Rash Location: chest, legs. Severity:  Mild Onset quality:  Gradual Timing:  Constant Progression:  Unchanged Chronicity:  New Relieved by:  Nothing Worsened by:  Nothing Associated symptoms: no abdominal pain, no diarrhea, no fatigue, no fever, no headaches, no hoarse voice, no induration, no joint pain, no myalgias, no nausea, no periorbital edema, no shortness of breath, no sore throat, no throat swelling, no tongue swelling, no URI, not vomiting and not wheezing       Past Medical History:  Diagnosis Date   Crohn's disease ([place])    Frequent headaches    Migraine    Ulcerative colitis [facility])     Patient Active Problem List   Diagnosis Date Noted   Abnormal MRI, liver    Ulcerative pancolitis without complication ([place])    Elevated troponin 11/14/2020   Chest pain of uncertain etiology 19/37/9024   Liver lesion 11/14/2020   D-dimer, elevated 11/14/2020   Iron deficiency anemia 05/16/2015   Crohn's disease ([place]) 05/05/2015    History reviewed. No pertinent surgical history.   OB History   No obstetric history on file.     History reviewed. No pertinent family history.  Social History   Tobacco Use   Smoking status: Never   Smokeless tobacco: Never  Vaping Use   Vaping Use: Never used  Substance Use Topics   Alcohol use: No   Drug use: No    Home Medications Prior to Admission medications   Medication Sig Start Date End Date Taking? Authorizing Provider  diphenhydrAMINE (BENADRYL) 25 MG tablet Take 1 tablet (25 mg total) by mouth every 6 (six) hours as needed. 03/14/21  Yes Shaleena Crusoe, DO  predniSONE (DELTASONE) 20 MG tablet Take 3 tablets (60 mg total) by mouth daily for 3 days.  03/14/21 03/17/21 Yes Gregor Dershem, DO  colchicine 0.6 MG tablet Take 1 tablet (0.6 mg total) by mouth 2 (two) times daily. 11/17/20   Ghimire, Henreitta Leber, MD  mesalamine (LIALDA) 1.2 g EC tablet Take 2 tablets (2.4 g total) by mouth daily with breakfast. 11/17/20 01/16/21  Ghimire, Henreitta Leber, MD  pantoprazole (PROTONIX) 40 MG tablet Take 1 tablet (40 mg total) by mouth daily at 12 noon. 11/17/20   Ghimire, Henreitta Leber, MD    Allergies    Hydrocodone-acetaminophen and Morphine and related  Review of Systems   Review of Systems  Constitutional:  Negative for fatigue and fever.  HENT:  Negative for hoarse voice and sore throat.   Respiratory:  Negative for shortness of breath and wheezing.   Gastrointestinal:  Negative for abdominal pain, diarrhea, nausea and vomiting.  Musculoskeletal:  Negative for arthralgias and myalgias.  Skin:  Positive for rash.  Neurological:  Negative for headaches.   Physical Exam Updated Vital Signs  ED Triage Vitals  Enc Vitals Group     BP 03/14/21 0640 128/76     Pulse Rate 03/14/21 0640 93     Resp 03/14/21 0640 18     Temp 03/14/21 0640 97.9 F (36.6 C)     Temp Source 03/14/21 0640 Oral     SpO2 03/14/21 0640 100 %     Weight 03/14/21 0640 256 lb (116.1 kg)  Height 03/14/21 0640 5' 5"  (1.651 m)     Head Circumference --      Peak Flow --      Pain Score 03/14/21 0639 9     Pain Loc --      Pain Edu? --      Excl. in [place]? --      Physical Exam Constitutional:      General: She is not in acute distress.    Appearance: She is not ill-appearing.  HENT:     Head: Normocephalic and atraumatic.     Nose: Nose normal. No congestion or rhinorrhea.     Mouth/Throat:     Mouth: Mucous membranes are moist.     Pharynx: No oropharyngeal exudate or posterior oropharyngeal erythema.  Eyes:     Extraocular Movements: Extraocular movements intact.     Pupils: Pupils are equal, round, and reactive to light.  Cardiovascular:     Pulses: Normal  pulses.  Pulmonary:     Effort: Pulmonary effort is normal.  Skin:    Capillary Refill: Capillary refill takes less than 2 seconds.     Findings: Rash present.  Neurological:     General: No focal deficit present.     Mental Status: She is alert.    ED Results / Procedures / Treatments   Labs (all labs ordered are listed, but only abnormal results are displayed) Labs Reviewed - No data to display  EKG None  Radiology No results found.  Procedures Procedures   Medications Ordered in ED Medications  acetaminophen (TYLENOL) tablet 650 mg (has no administration in time range)  predniSONE (DELTASONE) tablet 60 mg (has no administration in time range)    ED Course  I have reviewed the triage vital signs and the nursing notes.  Pertinent labs & imaging results that were available during my care of the patient were reviewed by me and considered in my medical decision making (see chart for details).    MDM Rules/Calculators/A&P                          Robin Fuller is here with rash.  Appears to be some sort of contact dermatitis.  No concern for anaphylaxis.  No GI symptoms.  No stridor or wheezing or respiratory distress or swelling of the tongue or lips.  Unknown what allergen could be.  Will prescribe prednisone and Benadryl.  Discharged in good condition.  This chart was dictated using voice recognition software.  Despite best efforts to proofread,  errors can occur which can change the documentation meaning.      Final Clinical Impression(s) / ED Diagnoses Final diagnoses:  Rash    Rx / DC Orders ED Discharge Orders          Ordered    predniSONE (DELTASONE) 20 MG tablet  Daily        03/14/21 0712    diphenhydrAMINE (BENADRYL) 25 MG tablet  Every 6 hours PRN        03/14/21 Hamer, Dillon, DO 03/14/21 [phone]

## 2021-03-14 NOTE — ED Triage Notes (Addendum)
Pt with red diffuse rash that started this morning. Unknown allergen

## 2021-05-09 ENCOUNTER — Other Ambulatory Visit: Payer: Self-pay

## 2021-05-09 ENCOUNTER — Emergency Department (HOSPITAL_BASED_OUTPATIENT_CLINIC_OR_DEPARTMENT_OTHER)
Admission: EM | Admit: 2021-05-09 | Discharge: 2021-05-09 | Disposition: A | Discharge status: Home / Self Care | Payer: Medicaid Other | Attending: Emergency Medicine | Admitting: Emergency Medicine

## 2021-05-09 ENCOUNTER — Encounter (HOSPITAL_BASED_OUTPATIENT_CLINIC_OR_DEPARTMENT_OTHER): Payer: Self-pay | Admitting: Emergency Medicine

## 2021-05-09 DIAGNOSIS — G43809 Other migraine, not intractable, without status migrainosus: Secondary | ICD-10-CM | POA: Insufficient documentation

## 2021-05-09 DIAGNOSIS — Z5329 Procedure and treatment not carried out because of patient's decision for other reasons: Secondary | ICD-10-CM | POA: Insufficient documentation

## 2021-05-09 DIAGNOSIS — Z79899 Other long term (current) drug therapy: Secondary | ICD-10-CM | POA: Insufficient documentation

## 2021-05-09 DIAGNOSIS — H5371 Glare sensitivity: Secondary | ICD-10-CM | POA: Insufficient documentation

## 2021-05-09 DIAGNOSIS — K5190 Ulcerative colitis, unspecified, without complications: Secondary | ICD-10-CM | POA: Diagnosis not present

## 2021-05-09 DIAGNOSIS — G43909 Migraine, unspecified, not intractable, without status migrainosus: Secondary | ICD-10-CM | POA: Diagnosis present

## 2021-05-09 MED ORDER — PROCHLORPERAZINE EDISYLATE 10 MG/2ML IJ SOLN
10.0000 mg | Freq: Once | INTRAMUSCULAR | Status: AC
  Administered 2021-05-09: 10 mg via INTRAMUSCULAR
  Filled 2021-05-09: qty 2

## 2021-05-09 MED ORDER — DIPHENHYDRAMINE HCL 25 MG PO CAPS
25.0000 mg | ORAL_CAPSULE | Freq: Once | ORAL | Status: AC
  Administered 2021-05-09: 25 mg via ORAL
  Filled 2021-05-09: qty 1

## 2021-05-09 NOTE — ED Notes (Signed)
Pt still not in her room, not in waiting room - unable to locate patient.

## 2021-05-09 NOTE — ED Triage Notes (Signed)
Pt c/o migraine that started today; sts Dilaudid is the only thing that works; migraine cocktails make her itch

## 2021-05-09 NOTE — ED Notes (Signed)
Went in to reassess patient but the patient was not in her room. PA aware.

## 2021-05-09 NOTE — ED Provider Notes (Signed)
[facility] EMERGENCY DEPARTMENT Provider Note   CSN: [id number] Arrival date & time: 05/09/21  1800     History Chief Complaint  Patient presents with   Migraine    Robin Fuller is a 26 y.o. female with history of migraines who presents to the emergency department with a migraine that started yesterday.  States she has not had 1 in a while.  This does tentacle to her migraines that she gets intermittently.  This is no different than her migraines.  She denies any focal weakness or numbness.  No nausea or vomiting.  She does report associated photophobia.  She has been seen by neurology in the past and was told that her migraines are infrequent enough that she would not need regular neurology follow-up.   Migraine      Home Medications Prior to Admission medications   Medication Sig Start Date End Date Taking? Authorizing Provider  colchicine 0.6 MG tablet Take 1 tablet (0.6 mg total) by mouth 2 (two) times daily. 11/17/20   Ghimire, Henreitta Leber, MD  diphenhydrAMINE (BENADRYL) 25 MG tablet Take 1 tablet (25 mg total) by mouth every 6 (six) hours as needed. 03/14/21   Lennice Sites, DO  mesalamine (LIALDA) 1.2 g EC tablet Take 2 tablets (2.4 g total) by mouth daily with breakfast. 11/17/20 01/16/21  Ghimire, Henreitta Leber, MD  pantoprazole (PROTONIX) 40 MG tablet Take 1 tablet (40 mg total) by mouth daily at 12 noon. 11/17/20   Jonetta Osgood, MD      Allergies    Hydrocodone-acetaminophen and Morphine and related    Review of Systems   Review of Systems  All other systems reviewed and are negative.  Physical Exam Updated Vital Signs BP 128/72    Pulse 82    Temp 98.1 F (36.7 C) (Oral)    Resp 18    Ht 5\' 5"  (1.651 m)    Wt 77.1 kg    SpO2 100%    BMI 28.29 kg/m  Physical Exam Vitals and nursing note reviewed.  Constitutional:      Appearance: Normal appearance.  HENT:     Head: Normocephalic and atraumatic.  Eyes:     General:        Right eye:  No discharge.        Left eye: No discharge.     Conjunctiva/sclera: Conjunctivae normal.  Pulmonary:     Effort: Pulmonary effort is normal.  Skin:    General: Skin is warm and dry.     Findings: No rash.  Neurological:     General: No focal deficit present.     Mental Status: She is alert and oriented to person, place, and time.     Comments: Cranial nerves II through XII are intact.  Extraocular movements are intact.  No horizontal or vertical nystagmus.  5/5 strength to the upper and lower extremities.  Normal sensation to the upper and lower extremities.  Normal finger-nose.  No dysmetria.  No pronator drift.  Answering all questions appropriately.  No facial droop.  Psychiatric:        Mood and Affect: Mood normal.        Behavior: Behavior normal.    ED Results / Procedures / Treatments   Labs (all labs ordered are listed, but only abnormal results are displayed) Labs Reviewed - No data to display  EKG None  Radiology No results found.  Procedures Procedures    Medications Ordered in ED Medications  prochlorperazine (  COMPAZINE) injection 10 mg (10 mg Intramuscular Given 05/09/21 1927)  diphenhydrAMINE (BENADRYL) capsule 25 mg (25 mg Oral Given 05/09/21 1926)    ED Course/ Medical Decision Making/ A&P                           Medical Decision Making Risk Prescription drug management.   This patient presents to the ED for concern of a migraine, this involves an extensive number of treatment options, and is a complaint that carries with it a high risk of complications and morbidity.  The differential diagnosis includes simple tension headache versus migraine.  No other suspicion for CVA at this time given that she has no neurological deficits this was identical to her longstanding history of migraines. I doubt space occupying lesion. I doubt intracranial bleed.   Co morbidities that complicate the patient evaluation  Ulcerative Colitis Migraines      Additional history obtained:  Additional history obtained from  External records from outside source obtained and reviewed including old ED visits.  No pertinent findings related to her care today.  I do not feel labs or imaging are necessary at this point.   Cardiac Monitoring:  The patient was maintained on a cardiac monitor.  I personally viewed and interpreted the cardiac monitored which showed an underlying rhythm of: Normal sinus rhythm.  Normal rate.  Oxygenating well on room air.   Medicines ordered and prescription drug management:  I ordered medication including Benadryl and Compazine for presumed headache.     Test Considered:  CT head   Problem List / ED Course:  Migraine     Shortly after getting pain medications patient likely left the emergency department via elopement.  I went to check on the patient twice and she was on the room.  I immediately notified nursing staff to investigate the issue.  Patient is nowhere to be found and presumed to have left the department.  Final Clinical Impression(s) / ED Diagnoses Final diagnoses:  Other migraine without status migrainosus, not intractable    Rx / DC Orders ED Discharge Orders     None         Cherrie Gauze 05/09/21 2111    Drenda Freeze, MD 05/11/21 1459

## 2022-02-26 ENCOUNTER — Other Ambulatory Visit: Payer: Self-pay

## 2022-02-26 DIAGNOSIS — K529 Noninfective gastroenteritis and colitis, unspecified: Secondary | ICD-10-CM | POA: Diagnosis not present

## 2022-02-26 DIAGNOSIS — R1032 Left lower quadrant pain: Secondary | ICD-10-CM | POA: Diagnosis present

## 2022-02-26 LAB — CBC
HCT: 41.8 % (ref 36.0–46.0)
Hemoglobin: 12.8 g/dL (ref 12.0–15.0)
MCH: 22.7 pg — ABNORMAL LOW (ref 26.0–34.0)
MCHC: 30.6 g/dL (ref 30.0–36.0)
MCV: 74.2 fL — ABNORMAL LOW (ref 80.0–100.0)
Platelets: 393 10*3/uL (ref 150–400)
RBC: 5.63 MIL/uL — ABNORMAL HIGH (ref 3.87–5.11)
RDW: 16.3 % — ABNORMAL HIGH (ref 11.5–15.5)
WBC: 8.9 10*3/uL (ref 4.0–10.5)
nRBC: 0 % (ref 0.0–0.2)

## 2022-02-26 LAB — URINALYSIS, MICROSCOPIC (REFLEX): RBC / HPF: NONE SEEN RBC/hpf (ref 0–5)

## 2022-02-26 LAB — PREGNANCY, URINE: Preg Test, Ur: NEGATIVE

## 2022-02-26 LAB — COMPREHENSIVE METABOLIC PANEL
ALT: 20 U/L (ref 0–44)
AST: 20 U/L (ref 15–41)
Albumin: 4 g/dL (ref 3.5–5.0)
Alkaline Phosphatase: 109 U/L (ref 38–126)
Anion gap: 7 (ref 5–15)
BUN: 7 mg/dL (ref 6–20)
CO2: 22 mmol/L (ref 22–32)
Calcium: 9.2 mg/dL (ref 8.9–10.3)
Chloride: 108 mmol/L (ref 98–111)
Creatinine, Ser: 0.87 mg/dL (ref 0.44–1.00)
GFR, Estimated: >60 mL/min (ref >60)
Glucose, Bld: 133 mg/dL — ABNORMAL HIGH (ref 70–99)
Potassium: 3.5 mmol/L (ref 3.5–5.1)
Sodium: 137 mmol/L (ref 135–145)
Total Bilirubin: 0.4 mg/dL (ref 0.3–1.2)
Total Protein: 7.8 g/dL (ref 6.5–8.1)

## 2022-02-26 LAB — URINALYSIS, ROUTINE W REFLEX MICROSCOPIC
Bilirubin Urine: NEGATIVE
Glucose, UA: NEGATIVE mg/dL
Hgb urine dipstick: NEGATIVE
Ketones, ur: NEGATIVE mg/dL
Leukocytes,Ua: NEGATIVE
Nitrite: NEGATIVE
Protein, ur: 30 mg/dL — AB
Specific Gravity, Urine: >=1.03 (ref 1.005–1.030)
pH: 5.5 (ref 5.0–8.0)

## 2022-02-26 LAB — LIPASE, BLOOD: Lipase: 30 U/L (ref 11–51)

## 2022-02-26 MED ORDER — ONDANSETRON 4 MG PO TBDP
4.0000 mg | ORAL_TABLET | Freq: Once | ORAL | Status: AC | PRN
  Administered 2022-02-26: 4 mg via ORAL
  Filled 2022-02-26: qty 1

## 2022-02-26 NOTE — ED Triage Notes (Signed)
Pt states she is having a colitis flare up. Lower abd pain, nausea and blood in stools x 1 week. Denies any vomiting or fever.

## 2022-02-27 ENCOUNTER — Emergency Department (HOSPITAL_BASED_OUTPATIENT_CLINIC_OR_DEPARTMENT_OTHER): Payer: Medicaid Other

## 2022-02-27 ENCOUNTER — Encounter (HOSPITAL_COMMUNITY): Payer: Self-pay

## 2022-02-27 ENCOUNTER — Emergency Department (HOSPITAL_BASED_OUTPATIENT_CLINIC_OR_DEPARTMENT_OTHER)
Admission: EM | Admit: 2022-02-27 | Discharge: 2022-02-27 | Disposition: A | Discharge status: Home / Self Care | Payer: Medicaid Other | Attending: Emergency Medicine | Admitting: Emergency Medicine

## 2022-02-27 DIAGNOSIS — K529 Noninfective gastroenteritis and colitis, unspecified: Secondary | ICD-10-CM

## 2022-02-27 DIAGNOSIS — K5190 Ulcerative colitis, unspecified, without complications: Secondary | ICD-10-CM | POA: Diagnosis present

## 2022-02-27 MED ORDER — PIPERACILLIN-TAZOBACTAM 3.375 G IVPB 30 MIN
3.3750 g | Freq: Once | INTRAVENOUS | Status: AC
  Administered 2022-02-27: 3.375 g via INTRAVENOUS
  Filled 2022-02-27: qty 50

## 2022-02-27 MED ORDER — HYDROMORPHONE HCL 1 MG/ML IJ SOLN
0.5000 mg | Freq: Once | INTRAMUSCULAR | Status: AC
  Administered 2022-02-27: 0.5 mg via INTRAVENOUS
  Filled 2022-02-27: qty 1

## 2022-02-27 MED ORDER — METHYLPREDNISOLONE SODIUM SUCC 125 MG IJ SOLR
125.0000 mg | INTRAMUSCULAR | Status: DC
  Filled 2022-02-27: qty 2

## 2022-02-27 MED ORDER — IOHEXOL 300 MG/ML  SOLN
100.0000 mL | Freq: Once | INTRAMUSCULAR | Status: AC | PRN
  Administered 2022-02-27: 100 mL via INTRAVENOUS

## 2022-02-27 MED ORDER — AMOXICILLIN-POT CLAVULANATE 875-125 MG PO TABS: 1.0000 | ORAL_TABLET | Freq: Two times a day (BID) | ORAL | 0 refills | Status: AC

## 2022-02-27 MED ORDER — SODIUM CHLORIDE 0.9 % IV SOLN: INTRAVENOUS | Status: DC

## 2022-02-27 NOTE — ED Notes (Signed)
Pt was picked up by her significant other

## 2022-02-27 NOTE — Progress Notes (Signed)
Hospitalist Transfer Note:  Transferring facility: [facility] Requesting provider: Dr. Randal Buba (EDP at [facility]) Reason for transfer: admission for further evaluation and management of exacerbation of inflammatory bowel disease.      26 y.o.  female with h/o inflammatory bowel disease (both Crohn's disease and ulcerative colitis listed in her chart) on chronic mesalamine, who presented to [facility] ED complaining of 1 week of abdominal discomfort associated with mucousy stool associated with some streaks of bright red blood, with CT abdomen/pelvis today showing evidence of rectosigmoid colitis, without abscess, perforation, or obstruction.  the patient reports a allergy to systemic corticosteroids, and therefore refused initiation of solumedrol at [facility];  She reports that her most recent colonoscopy occurred 2 years ago.   Vital signs in the ED were notable for the following: Afebrile, systolic blood pressures in the 1 teens to 120s mmHg.   Medications administered prior to transfer included the following: IV Dilaudid, Zosyn, Zofran and IV fluids.   Case has not been discussed with GI.   Subsequently, I accepted this patient for transfer for inpatient admission to a med-surg bed at [facility] or [facility] (first available) for further work-up and management of the above .       Check [email or web address] for on-call coverage.   Nursing staff, Please call [place] number on Amion as soon as patient's arrival, so appropriate admitting provider can evaluate the pt.     Babs Bertin, DO Hospitalist

## 2022-02-27 NOTE — ED Provider Notes (Addendum)
[facility] Provider Note   CSN: [id number] Arrival date & time: 02/26/22  2225     History  Chief Complaint  Patient presents with   Rectal Bleeding   Abdominal Pain    Robin Fuller is a 26 y.o. female.  The history is provided by the patient.  Rectal Bleeding Quality:  Bright red Timing:  Intermittent Chronicity:  Recurrent Context: defecation   Similar prior episodes: yes   Relieved by:  Nothing Worsened by:  Nothing Ineffective treatments:  None tried Associated symptoms: abdominal pain   Risk factors: hx of IBD   Abdominal Pain Pain location:  LLQ and RLQ Pain radiates to:  Does not radiate Pain severity:  Severe Onset quality:  Gradual Timing:  Constant Progression:  Unchanged Chronicity:  Recurrent Context: not laxative use   Relieved by:  Nothing Worsened by:  Nothing Ineffective treatments:  None tried Associated symptoms: hematochezia   Risk factors: not pregnant   Patient with Crohns and UC who reports she cannot take steroids presents with abdominal pain and rectal bleeding.  Patient states she is also allergic to Fentanyl and morphine but dilaudid does not cause an allergic reaction and that is what they always give her.       Home Medications Prior to Admission medications   Medication Sig Start Date End Date Taking? Authorizing Provider  colchicine 0.6 MG tablet Take 1 tablet (0.6 mg total) by mouth 2 (two) times daily. 11/17/20   Ghimire, Henreitta Leber, MD  diphenhydrAMINE (BENADRYL) 25 MG tablet Take 1 tablet (25 mg total) by mouth every 6 (six) hours as needed. 03/14/21   Lennice Sites, DO  mesalamine (LIALDA) 1.2 g EC tablet Take 2 tablets (2.4 g total) by mouth daily with breakfast. 11/17/20 01/16/21  Ghimire, Henreitta Leber, MD  pantoprazole (PROTONIX) 40 MG tablet Take 1 tablet (40 mg total) by mouth daily at 12 noon. 11/17/20   Jonetta Osgood, MD      Allergies    Prednisone, Hydrocodone-acetaminophen,  and Morphine and related    Review of Systems   Review of Systems  Gastrointestinal:  Positive for abdominal pain and hematochezia.    Physical Exam Updated Vital Signs BP 120/85 (BP Location: Right Arm)   Pulse 80   Temp 98.8 F (37.1 C) (Oral)   Resp 18   Ht 5' 5"  (1.651 m)   Wt 79.4 kg   SpO2 99%   BMI 29.12 kg/m  Physical Exam  ED Results / Procedures / Treatments   Labs (all labs ordered are listed, but only abnormal results are displayed) Results for orders placed or performed during the hospital encounter of 02/27/22  Lipase, blood  Result Value Ref Range   Lipase 30 11 - 51 U/L  Comprehensive metabolic panel  Result Value Ref Range   Sodium 137 135 - 145 mmol/L   Potassium 3.5 3.5 - 5.1 mmol/L   Chloride 108 98 - 111 mmol/L   CO2 22 22 - 32 mmol/L   Glucose, Bld 133 (H) 70 - 99 mg/dL   BUN 7 6 - 20 mg/dL   Creatinine, Ser 0.87 0.44 - 1.00 mg/dL   Calcium 9.2 8.9 - 10.3 mg/dL   Total Protein 7.8 6.5 - 8.1 g/dL   Albumin 4.0 3.5 - 5.0 g/dL   AST 20 15 - 41 U/L   ALT 20 0 - 44 U/L   Alkaline Phosphatase 109 38 - 126 U/L   Total Bilirubin 0.4 0.3 - 1.2  mg/dL   GFR, Estimated >60 >60 mL/min   Anion gap 7 5 - 15  CBC  Result Value Ref Range   WBC 8.9 4.0 - 10.5 K/uL   RBC 5.63 (H) 3.87 - 5.11 MIL/uL   Hemoglobin 12.8 12.0 - 15.0 g/dL   HCT 41.8 36.0 - 46.0 %   MCV 74.2 (L) 80.0 - 100.0 fL   MCH 22.7 (L) 26.0 - 34.0 pg   MCHC 30.6 30.0 - 36.0 g/dL   RDW 16.3 (H) 11.5 - 15.5 %   Platelets 393 150 - 400 K/uL   nRBC 0.0 0.0 - 0.2 %  Urinalysis, Routine w reflex microscopic Urine, Clean Catch  Result Value Ref Range   Color, Urine YELLOW YELLOW   APPearance HAZY (A) CLEAR   Specific Gravity, Urine >=1.030 1.005 - 1.030   pH 5.5 5.0 - 8.0   Glucose, UA NEGATIVE NEGATIVE mg/dL   Hgb urine dipstick NEGATIVE NEGATIVE   Bilirubin Urine NEGATIVE NEGATIVE   Ketones, ur NEGATIVE NEGATIVE mg/dL   Protein, ur 30 (A) NEGATIVE mg/dL   Nitrite NEGATIVE NEGATIVE    Leukocytes,Ua NEGATIVE NEGATIVE  Pregnancy, urine  Result Value Ref Range   Preg Test, Ur NEGATIVE NEGATIVE  Urinalysis, Microscopic (reflex)  Result Value Ref Range   RBC / HPF NONE SEEN 0 - 5 RBC/hpf   WBC, UA 6-10 0 - 5 WBC/hpf   Bacteria, UA FEW (A) NONE SEEN   Squamous Epithelial / LPF 6-10 0 - 5   CT ABDOMEN PELVIS W CONTRAST  Result Date: 02/27/2022 CLINICAL DATA:  Lower abdominal pain, nausea, and blood in stool for 1 week. History of Crohn's disease and ulcerative colitis. EXAM: CT ABDOMEN AND PELVIS WITH CONTRAST TECHNIQUE: Multidetector CT imaging of the abdomen and pelvis was performed using the standard protocol following bolus administration of intravenous contrast. RADIATION DOSE REDUCTION: This exam was performed according to the departmental dose-optimization program which includes automated exposure control, adjustment of the mA and/or kV according to patient size and/or use of iterative reconstruction technique. CONTRAST:  172m OMNIPAQUE IOHEXOL 300 MG/ML  SOLN COMPARISON:  10/07/2020. FINDINGS: Lower chest: No acute abnormality. Hepatobiliary: No focal liver abnormality is seen. No gallstones, gallbladder wall thickening, or biliary dilatation. Pancreas: Unremarkable. No pancreatic ductal dilatation or surrounding inflammatory changes. Spleen: Normal in size without focal abnormality. Adrenals/Urinary Tract: The adrenal glands are within normal limits. The kidneys enhance symmetrically. No renal calculus or hydronephrosis. The bladder is unremarkable. Stomach/Bowel: The stomach is within normal limits. No bowel obstruction, free air, or pneumatosis. Normal appendix is seen in the right lower quadrant. There is thickening of the walls of the distal sigmoid colon and rectum. Perirectal fat stranding is noted. Vascular/Lymphatic: No significant vascular findings. Prominent lymph nodes are noted in the perirectal space. Reproductive: Uterus and bilateral adnexa are unremarkable. Other:  No abdominopelvic ascites. Musculoskeletal: No acute osseous abnormality. IMPRESSION: Thickening of the walls of the distal sigmoid colon and rectum with surrounding fat stranding, suggesting colitis and may be related to patient's history of ulcerative colitis and Crohn's disease. No bowel obstruction or free air. Electronically Signed   By: LBrett FairyM.D.   On: 02/27/2022 01:23     Radiology CT ABDOMEN PELVIS W CONTRAST  Result Date: 02/27/2022 CLINICAL DATA:  Lower abdominal pain, nausea, and blood in stool for 1 week. History of Crohn's disease and ulcerative colitis. EXAM: CT ABDOMEN AND PELVIS WITH CONTRAST TECHNIQUE: Multidetector CT imaging of the abdomen and pelvis was performed using  the standard protocol following bolus administration of intravenous contrast. RADIATION DOSE REDUCTION: This exam was performed according to the departmental dose-optimization program which includes automated exposure control, adjustment of the mA and/or kV according to patient size and/or use of iterative reconstruction technique. CONTRAST:  178m OMNIPAQUE IOHEXOL 300 MG/ML  SOLN COMPARISON:  10/07/2020. FINDINGS: Lower chest: No acute abnormality. Hepatobiliary: No focal liver abnormality is seen. No gallstones, gallbladder wall thickening, or biliary dilatation. Pancreas: Unremarkable. No pancreatic ductal dilatation or surrounding inflammatory changes. Spleen: Normal in size without focal abnormality. Adrenals/Urinary Tract: The adrenal glands are within normal limits. The kidneys enhance symmetrically. No renal calculus or hydronephrosis. The bladder is unremarkable. Stomach/Bowel: The stomach is within normal limits. No bowel obstruction, free air, or pneumatosis. Normal appendix is seen in the right lower quadrant. There is thickening of the walls of the distal sigmoid colon and rectum. Perirectal fat stranding is noted. Vascular/Lymphatic: No significant vascular findings. Prominent lymph nodes are noted in  the perirectal space. Reproductive: Uterus and bilateral adnexa are unremarkable. Other: No abdominopelvic ascites. Musculoskeletal: No acute osseous abnormality. IMPRESSION: Thickening of the walls of the distal sigmoid colon and rectum with surrounding fat stranding, suggesting colitis and may be related to patient's history of ulcerative colitis and Crohn's disease. No bowel obstruction or free air. Electronically Signed   By: LBrett FairyM.D.   On: 02/27/2022 01:23    Procedures Procedures    Medications Ordered in ED Medications  0.9 %  sodium chloride infusion ( Intravenous New Bag/Given 02/27/22 0207)  ondansetron (ZOFRAN-ODT) disintegrating tablet 4 mg (4 mg Oral Given 02/26/22 2237)  iohexol (OMNIPAQUE) 300 MG/ML solution 100 mL (100 mLs Intravenous Contrast Given 02/27/22 0114)  piperacillin-tazobactam (ZOSYN) IVPB 3.375 g (3.375 g Intravenous New Bag/Given 02/27/22 0209)  HYDROmorphone (DILAUDID) injection 0.5 mg (0.5 mg Intravenous Given 02/27/22 0200)    ED Course/ Medical Decision Making/ A&P                           Medical Decision Making Patient Crohns and UC on Liada and allergies to morphine, fentanyl and steroids who presents with rectal bleeding and abdominal pain x 1 week  Amount and/or Complexity of Data Reviewed External Data Reviewed: notes.    Details: Previous notes reviewed  Labs: ordered.    Details: All labs reviewed: pregnancy is negative urine is negative for UTI.  Lipase normal 30. Normal sodium 137, normal potassium 3.5, normal creatinine .87, normal LFTs. Normal white count  8.9, normal 12.8, normal platelet 393K Radiology: ordered and independent interpretation performed.    Details: CT scan with colitis  Discussion of management or test interpretation with external provider(s[facility]  Risk Prescription drug management. Decision regarding hospitalization. Risk Details: Based on labs and  imaging plan is to admit patient.  [facility] and they have no beds for admission. Given patient's allergies, reported level of pain and CTfindings [facility]   [facility] patient [facility].  Patient has child care issues and she cannot be admitted at this time.  Patient is signing out against medical advice because she has child care issues.  She would like to schedule an admission.  EDP stated this is not possible.  EDP stated we have already called for admission but patient is stating she cannot stay.  Patient will leave against medical advice.      Final Clinical Impression(s) / ED Diagnoses Final diagnoses:  Colitis       Roselina Burgueno, MD 02/27/22 [id number]

## 2022-02-28 ENCOUNTER — Encounter (HOSPITAL_BASED_OUTPATIENT_CLINIC_OR_DEPARTMENT_OTHER): Payer: Self-pay | Admitting: Urology

## 2022-02-28 ENCOUNTER — Emergency Department (HOSPITAL_BASED_OUTPATIENT_CLINIC_OR_DEPARTMENT_OTHER)
Admission: EM | Admit: 2022-02-28 | Discharge: 2022-03-01 | Disposition: A | Discharge status: Home / Self Care | Payer: Medicaid Other | Attending: Emergency Medicine | Admitting: Emergency Medicine

## 2022-02-28 ENCOUNTER — Other Ambulatory Visit: Payer: Self-pay

## 2022-02-28 DIAGNOSIS — R197 Diarrhea, unspecified: Secondary | ICD-10-CM | POA: Diagnosis not present

## 2022-02-28 DIAGNOSIS — R1032 Left lower quadrant pain: Secondary | ICD-10-CM | POA: Insufficient documentation

## 2022-02-28 DIAGNOSIS — K5190 Ulcerative colitis, unspecified, without complications: Secondary | ICD-10-CM

## 2022-02-28 DIAGNOSIS — R1030 Lower abdominal pain, unspecified: Secondary | ICD-10-CM | POA: Diagnosis present

## 2022-02-28 MED ORDER — METHYLPREDNISOLONE SODIUM SUCC 125 MG IJ SOLR
125.0000 mg | Freq: Once | INTRAMUSCULAR | Status: AC
  Administered 2022-02-28: 125 mg via INTRAVENOUS
  Filled 2022-02-28: qty 2

## 2022-02-28 MED ORDER — FENTANYL CITRATE PF 50 MCG/ML IJ SOSY
50.0000 ug | PREFILLED_SYRINGE | Freq: Once | INTRAMUSCULAR | Status: DC
  Filled 2022-02-28: qty 1

## 2022-02-28 MED ORDER — SODIUM CHLORIDE 0.9 % IV BOLUS
1000.0000 mL | Freq: Once | INTRAVENOUS | Status: AC
  Administered 2022-02-28: 1000 mL via INTRAVENOUS

## 2022-02-28 NOTE — ED Notes (Signed)
Pt states that fentanyl makes her itch and that she would like a different medication for pain.

## 2022-02-28 NOTE — ED Triage Notes (Signed)
Pt was seen here for colitis yesterday, left AMA because wanted to be admitted to [facility], states is back to be admitted  Pt still having bloody stools and abdominal pain   Was prescribed amoxicillin but never picked it up

## 2022-02-28 NOTE — ED Provider Notes (Addendum)
[facility] Provider Note   CSN: [id number] Arrival date & time: 02/28/22  2009     History  Chief Complaint  Patient presents with   Abdominal Pain    Robin Fuller is a 26 y.o. female.  Patient is a 26 year old female with past medical history of ulcerative colitis.  She presents today with complaints of lower abdominal pain and loose, bloody stools.  This has been ongoing for the past 1 week.  Patient seen 2 evenings ago with similar complaints.  She had planned to be admitted, however left the hospital due to childcare reasons.  She returns today with ongoing pain and bleeding.  She denies any fevers or chills.  She denies any aggravating or alleviating factors.  She was not given any medications to take at home when discharged 2 days ago.  The history is provided by the patient.       Home Medications Prior to Admission medications   Medication Sig Start Date End Date Taking? Authorizing Provider  amoxicillin-clavulanate (AUGMENTIN) 875-125 MG tablet Take 1 tablet by mouth every 12 (twelve) hours. 02/27/22   Palumbo, April, MD  colchicine 0.6 MG tablet Take 1 tablet (0.6 mg total) by mouth 2 (two) times daily. 11/17/20   Ghimire, Henreitta Leber, MD  diphenhydrAMINE (BENADRYL) 25 MG tablet Take 1 tablet (25 mg total) by mouth every 6 (six) hours as needed. 03/14/21   Lennice Sites, DO  mesalamine (LIALDA) 1.2 g EC tablet Take 2 tablets (2.4 g total) by mouth daily with breakfast. 11/17/20 01/16/21  Ghimire, Henreitta Leber, MD  pantoprazole (PROTONIX) 40 MG tablet Take 1 tablet (40 mg total) by mouth daily at 12 noon. 11/17/20   Jonetta Osgood, MD      Allergies    Prednisone, Hydrocodone-acetaminophen, and Morphine and related    Review of Systems   Review of Systems  All other systems reviewed and are negative.   Physical Exam Updated Vital Signs BP (!) 141/78   Pulse 84   Temp 99 F (37.2 C) (Oral)   Resp 16   Ht 5' 5"  (1.651 m)    Wt 79.4 kg   SpO2 100%   BMI 29.13 kg/m  Physical Exam Vitals and nursing note reviewed.  Constitutional:      General: She is not in acute distress.    Appearance: She is well-developed. She is not diaphoretic.  HENT:     Head: Normocephalic and atraumatic.  Cardiovascular:     Rate and Rhythm: Normal rate and regular rhythm.     Heart sounds: No murmur heard.    No friction rub. No gallop.  Pulmonary:     Effort: Pulmonary effort is normal. No respiratory distress.     Breath sounds: Normal breath sounds. No wheezing.  Abdominal:     General: Bowel sounds are normal. There is no distension.     Palpations: Abdomen is soft.     Tenderness: There is abdominal tenderness in the suprapubic area and left lower quadrant. There is no right CVA tenderness, left CVA tenderness, guarding or rebound.  Musculoskeletal:        General: Normal range of motion.     Cervical back: Normal range of motion and neck supple.  Skin:    General: Skin is warm and dry.  Neurological:     General: No focal deficit present.     Mental Status: She is alert and oriented to person, place, and time.     ED  Results / Procedures / Treatments   Labs (all labs ordered are listed, but only abnormal results are displayed) Labs Reviewed  CBC WITH DIFFERENTIAL/PLATELET    EKG None  Radiology CT ABDOMEN PELVIS W CONTRAST  Result Date: 02/27/2022 CLINICAL DATA:  Lower abdominal pain, nausea, and blood in stool for 1 week. History of Crohn's disease and ulcerative colitis. EXAM: CT ABDOMEN AND PELVIS WITH CONTRAST TECHNIQUE: Multidetector CT imaging of the abdomen and pelvis was performed using the standard protocol following bolus administration of intravenous contrast. RADIATION DOSE REDUCTION: This exam was performed according to the departmental dose-optimization program which includes automated exposure control, adjustment of the mA and/or kV according to patient size and/or use of iterative  reconstruction technique. CONTRAST:  155m OMNIPAQUE IOHEXOL 300 MG/ML  SOLN COMPARISON:  10/07/2020. FINDINGS: Lower chest: No acute abnormality. Hepatobiliary: No focal liver abnormality is seen. No gallstones, gallbladder wall thickening, or biliary dilatation. Pancreas: Unremarkable. No pancreatic ductal dilatation or surrounding inflammatory changes. Spleen: Normal in size without focal abnormality. Adrenals/Urinary Tract: The adrenal glands are within normal limits. The kidneys enhance symmetrically. No renal calculus or hydronephrosis. The bladder is unremarkable. Stomach/Bowel: The stomach is within normal limits. No bowel obstruction, free air, or pneumatosis. Normal appendix is seen in the right lower quadrant. There is thickening of the walls of the distal sigmoid colon and rectum. Perirectal fat stranding is noted. Vascular/Lymphatic: No significant vascular findings. Prominent lymph nodes are noted in the perirectal space. Reproductive: Uterus and bilateral adnexa are unremarkable. Other: No abdominopelvic ascites. Musculoskeletal: No acute osseous abnormality. IMPRESSION: Thickening of the walls of the distal sigmoid colon and rectum with surrounding fat stranding, suggesting colitis and may be related to patient's history of ulcerative colitis and Crohn's disease. No bowel obstruction or free air. Electronically Signed   By: LBrett FairyM.D.   On: 02/27/2022 01:23    Procedures Procedures    Medications Ordered in ED Medications  sodium chloride 0.9 % bolus 1,000 mL (has no administration in time range)  fentaNYL (SUBLIMAZE) injection 50 mcg (has no administration in time range)  methylPREDNISolone sodium succinate (SOLU-MEDROL) 125 mg/2 mL injection 125 mg (has no administration in time range)    ED Course/ Medical Decision Making/ A&P  Patient with history of ulcerative colitis presenting with lower abdominal pain and bloody stools.  She had a CT scan performed 2 days ago showing  colitis.  There were plans for her to be admitted, however [facility] had to [facility] and also reports childcare obligations.  Patient arrives here afebrile and is clinically well-appearing.  Vital signs are stable.  Her abdomen is benign.  IV fluids administered along with a dose of Solu-Medrol and Dilaudid x 2.  She is now feeling improved.  I discussed the disposition with the patient.  She would prefer not to be in the hospital and would prefer to try an outpatient course of steroids.  She will be prescribed prednisone along with Percocet.  She has to follow-up with her gastroenterologist in the next 2 days if not improving.  CBC repeated here today showing no drop in her hemoglobin and no leukocytosis.  Also of note is that her allergy to prednisone is "inflammation in her chest".  This does not sound like a true allergy and I feel as though she can be treated safely with this medication.  Final Clinical Impression(s) / ED Diagnoses Final diagnoses:  None  Rx / DC Orders ED Discharge Orders     None         Veryl Speak, MD 03/01/22 [facility]    Veryl Speak, MD 03/01/22 [phone]

## 2022-03-01 LAB — CBC WITH DIFFERENTIAL/PLATELET
Abs Immature Granulocytes: 0.02 10*3/uL (ref 0.00–0.07)
Basophils Absolute: 0.1 10*3/uL (ref 0.0–0.1)
Basophils Relative: 1 %
Eosinophils Absolute: 0.2 10*3/uL (ref 0.0–0.5)
Eosinophils Relative: 3 %
HCT: 41.5 % (ref 36.0–46.0)
Hemoglobin: 12.8 g/dL (ref 12.0–15.0)
Immature Granulocytes: 0 %
Lymphocytes Relative: 30 %
Lymphs Abs: 2.3 10*3/uL (ref 0.7–4.0)
MCH: 22.7 pg — ABNORMAL LOW (ref 26.0–34.0)
MCHC: 30.8 g/dL (ref 30.0–36.0)
MCV: 73.5 fL — ABNORMAL LOW (ref 80.0–100.0)
Monocytes Absolute: 0.6 10*3/uL (ref 0.1–1.0)
Monocytes Relative: 7 %
Neutro Abs: 4.5 10*3/uL (ref 1.7–7.7)
Neutrophils Relative %: 59 %
Platelets: 383 10*3/uL (ref 150–400)
RBC: 5.65 MIL/uL — ABNORMAL HIGH (ref 3.87–5.11)
RDW: 16.4 % — ABNORMAL HIGH (ref 11.5–15.5)
WBC: 7.6 10*3/uL (ref 4.0–10.5)
nRBC: 0 % (ref 0.0–0.2)

## 2022-03-01 MED ORDER — OXYCODONE-ACETAMINOPHEN 5-325 MG PO TABS: 1.0000 | ORAL_TABLET | Freq: Four times a day (QID) | ORAL | 0 refills | Status: DC | PRN

## 2022-03-01 MED ORDER — PREDNISONE 20 MG PO TABS: ORAL_TABLET | ORAL | 0 refills | Status: AC

## 2022-03-01 MED ORDER — HYDROMORPHONE HCL 1 MG/ML IJ SOLN
1.0000 mg | Freq: Once | INTRAMUSCULAR | Status: AC
  Administered 2022-03-01: 1 mg via INTRAVENOUS
  Filled 2022-03-01: qty 1

## 2022-03-01 NOTE — Discharge Instructions (Addendum)
Begin taking prednisone and Percocet as prescribed.  Follow-up with your gastroenterologist if symptoms are not improving in the next few days.

## 2022-03-22 ENCOUNTER — Emergency Department (HOSPITAL_BASED_OUTPATIENT_CLINIC_OR_DEPARTMENT_OTHER): Payer: Medicaid Other

## 2022-03-22 ENCOUNTER — Emergency Department (HOSPITAL_BASED_OUTPATIENT_CLINIC_OR_DEPARTMENT_OTHER)
Admission: EM | Admit: 2022-03-22 | Discharge: 2022-03-22 | Disposition: A | Discharge status: Home / Self Care | Payer: Medicaid Other | Attending: Emergency Medicine | Admitting: Emergency Medicine

## 2022-03-22 ENCOUNTER — Encounter (HOSPITAL_BASED_OUTPATIENT_CLINIC_OR_DEPARTMENT_OTHER): Payer: Self-pay | Admitting: Urology

## 2022-03-22 ENCOUNTER — Other Ambulatory Visit: Payer: Self-pay

## 2022-03-22 DIAGNOSIS — R112 Nausea with vomiting, unspecified: Secondary | ICD-10-CM | POA: Diagnosis not present

## 2022-03-22 DIAGNOSIS — Z1152 Encounter for screening for COVID-19: Secondary | ICD-10-CM | POA: Insufficient documentation

## 2022-03-22 DIAGNOSIS — M791 Myalgia, unspecified site: Secondary | ICD-10-CM | POA: Diagnosis not present

## 2022-03-22 DIAGNOSIS — R509 Fever, unspecified: Secondary | ICD-10-CM | POA: Insufficient documentation

## 2022-03-22 DIAGNOSIS — Z5321 Procedure and treatment not carried out due to patient leaving prior to being seen by health care provider: Secondary | ICD-10-CM | POA: Insufficient documentation

## 2022-03-22 DIAGNOSIS — R6889 Other general symptoms and signs: Secondary | ICD-10-CM

## 2022-03-22 LAB — RESP PANEL BY RT-PCR (RSV, FLU A&B, COVID)  RVPGX2
Influenza A by PCR: NEGATIVE
Influenza B by PCR: NEGATIVE
Resp Syncytial Virus by PCR: NEGATIVE
SARS Coronavirus 2 by RT PCR: NEGATIVE

## 2022-03-22 MED ORDER — ACETAMINOPHEN 325 MG PO TABS
650.0000 mg | ORAL_TABLET | Freq: Once | ORAL | Status: AC | PRN
  Administered 2022-03-22: 650 mg via ORAL
  Filled 2022-03-22: qty 2

## 2022-03-22 MED ORDER — HYDROMORPHONE HCL 1 MG/ML IJ SOLN
1.0000 mg | Freq: Once | INTRAMUSCULAR | Status: AC
  Administered 2022-03-22: 1 mg via INTRAMUSCULAR
  Filled 2022-03-22: qty 1

## 2022-03-22 MED ORDER — OXYCODONE HCL 10 MG PO TABS: 10.0000 mg | ORAL_TABLET | Freq: Three times a day (TID) | ORAL | 0 refills | Status: AC | PRN

## 2022-03-22 MED ORDER — OXYCODONE HCL 5 MG PO TABS: 10.0000 mg | ORAL_TABLET | Freq: Once | ORAL | Status: DC

## 2022-03-22 NOTE — ED Notes (Signed)
Pt refused tylenol or ibuprofen for fever states it exacerbates her chron's symptoms

## 2022-03-22 NOTE — Discharge Instructions (Signed)
Please return if symptoms worsen as discussed.

## 2022-03-22 NOTE — ED Provider Notes (Signed)
[facility]GENC[facility] DEPARTMENT Provider Note   CSN: [id number] Arrival date & time: 03/22/22  3295     History  Chief Complaint  Patient presents with   Flu like symtoms     Robin Fuller is a 26 y.o. female.  Patient here with flulike symptoms.  History of Crohn's disease.  Symptoms for 1 day with fever.  Having body aches throughout.  Denies any abdominal pain nausea vomiting diarrhea.  Does not feel like her Crohn's flare.  Symptoms got worse today but may be the last 3 days have been ongoing.  She takes chronic narcotics.  She is due for prescription refill today.  Has not had her dose today.  She denies any shortness of breath, chest pain, nausea, pain with urination.  The history is provided by the patient.       Home Medications Prior to Admission medications   Medication Sig Start Date End Date Taking? Authorizing Provider  oxyCODONE 10 MG TABS Take 1 tablet (10 mg total) by mouth every 8 (eight) hours as needed for up to 10 doses for breakthrough pain. 03/22/22  Yes Armarion Greek, DO  amoxicillin-clavulanate (AUGMENTIN) 875-125 MG tablet Take 1 tablet by mouth every 12 (twelve) hours. 02/27/22   Palumbo, April, MD  colchicine 0.6 MG tablet Take 1 tablet (0.6 mg total) by mouth 2 (two) times daily. 11/17/20   Ghimire, Henreitta Leber, MD  diphenhydrAMINE (BENADRYL) 25 MG tablet Take 1 tablet (25 mg total) by mouth every 6 (six) hours as needed. 03/14/21   Lennice Sites, DO  mesalamine (LIALDA) 1.2 g EC tablet Take 2 tablets (2.4 g total) by mouth daily with breakfast. 11/17/20 01/16/21  Ghimire, Henreitta Leber, MD  oxyCODONE-acetaminophen (PERCOCET) 5-325 MG tablet Take 1-2 tablets by mouth every 6 (six) hours as needed. 03/01/22   Veryl Speak, MD  pantoprazole (PROTONIX) 40 MG tablet Take 1 tablet (40 mg total) by mouth daily at 12 noon. 11/17/20   Ghimire, Henreitta Leber, MD  predniSONE (DELTASONE) 20 MG tablet 3 Tabs PO Days 1-3, then 2 tabs PO Days 4-6, then 1 tab  PO Day 7-9, then Half Tab PO Day 10-12 03/01/22   Veryl Speak, MD      Allergies    Prednisone, Fentanyl, Hydrocodone-acetaminophen, and Morphine and related    Review of Systems   Review of Systems  Physical Exam Updated Vital Signs BP 116/70   Pulse (!) 107   Temp 98.9 F (37.2 C) (Oral)   Resp 20   Ht 5' 5"  (1.651 m)   Wt 79.4 kg   SpO2 100%   BMI 29.13 kg/m  Physical Exam Vitals and nursing note reviewed.  Constitutional:      General: She is not in acute distress.    Appearance: She is well-developed. She is not ill-appearing.  HENT:     Head: Normocephalic and atraumatic.     Nose: Nose normal.     Mouth/Throat:     Mouth: Mucous membranes are moist.  Eyes:     Extraocular Movements: Extraocular movements intact.     Conjunctiva/sclera: Conjunctivae normal.     Pupils: Pupils are equal, round, and reactive to light.  Cardiovascular:     Rate and Rhythm: Normal rate and regular rhythm.     Pulses: Normal pulses.     Heart sounds: Normal heart sounds. No murmur heard. Pulmonary:     Effort: Pulmonary effort is normal. No respiratory distress.     Breath sounds: Normal breath sounds.  Abdominal:     General: Abdomen is flat.     Palpations: Abdomen is soft.     Tenderness: There is no abdominal tenderness.  Musculoskeletal:        General: No swelling.     Cervical back: Normal range of motion and neck supple.  Skin:    General: Skin is warm and dry.     Capillary Refill: Capillary refill takes less than 2 seconds.  Neurological:     General: No focal deficit present.     Mental Status: She is alert.  Psychiatric:        Mood and Affect: Mood normal.     ED Results / Procedures / Treatments   Labs (all labs ordered are listed, but only abnormal results are displayed) Labs Reviewed  RESP PANEL BY RT-PCR (RSV, FLU A&B, COVID)  [id number]    EKG None  Radiology DG Chest Portable 1 View  Result Date: 03/22/2022 CLINICAL DATA:  cough EXAM: PORTABLE  CHEST - 1 VIEW COMPARISON:  11/15/2020 FINDINGS: Cardiac silhouette is unremarkable. No pneumothorax or pleural effusion. The lungs are clear. The visualized skeletal structures are unremarkable. IMPRESSION: No acute cardiopulmonary process. Electronically Signed   By: Sammie Bench M.D.   On: 03/22/2022 21:08    Procedures Procedures    Medications Ordered in ED Medications  acetaminophen (TYLENOL) tablet 650 mg (650 mg Oral Given 03/22/22 2100)  HYDROmorphone (DILAUDID) injection 1 mg (1 mg Intramuscular Given 03/22/22 2234)    ED Course/ Medical Decision Making/ A&P                           Medical Decision Making Amount and/or Complexity of Data Reviewed Radiology: ordered.  Risk OTC drugs. Prescription drug management.   Randall Campbell-Manning is here with nausea vomiting fever, body aches, flulike symptoms.  Patient febrile and tachycardic upon arrival but resolved with Tylenol.  Flulike symptoms for the last few days.  COVID and flu test are negative.  Chest x-ray shows no evidence of pneumonia.  She has a history of Crohn's but she denies any abdominal pain.  She states this does not feel like her Crohn's pain.  She has no black or bloody stools.  She denies any pain with urination.  I reviewed interpreted labs and chest x-ray.  Overall she is due for a new narcotic prescription but has not been able to see her doctor.  She has been without her narcotics for a day and maybe she is having some withdrawal symptoms as well.  Overall I suspect that this is a viral process.  Will write her for a few doses of Roxicodone.  She understands return precautions including if she develops abdominal pain I think we need to work that out more at this time she declined further workup for that.  Which I think is reasonable.  Discharged in good condition.  Understands return precautions.  This chart was dictated using voice recognition software.  Despite best efforts to proofread,  errors can  occur which can change the documentation meaning.         Final Clinical Impression(s) / ED Diagnoses Final diagnoses:  Flu-like symptoms    Rx / DC Orders ED Discharge Orders          Ordered    oxyCODONE 10 MG TABS  Every 8 hours PRN        03/22/22 2238  Lennice Sites, DO 03/22/22 2330

## 2022-03-22 NOTE — ED Notes (Signed)
Pt teaching provided on medications that may cause drowsiness. Pt instructed not to drive or operate heavy machinery while taking the prescribed medication. Pt verbalized understanding.   Pt provided discharge instructions and prescription information. Pt was given the opportunity to ask questions and questions were answered.

## 2022-03-22 NOTE — ED Triage Notes (Signed)
Pt states ha, body aches, chills, dry cough that started 3 days ago  Pt denies fever

## 2022-11-15 ENCOUNTER — Emergency Department (HOSPITAL_BASED_OUTPATIENT_CLINIC_OR_DEPARTMENT_OTHER)
Admission: EM | Admit: 2022-11-15 | Discharge: 2022-11-15 | Disposition: A | Discharge status: Home / Self Care | Payer: Medicaid Other | Attending: Emergency Medicine | Admitting: Emergency Medicine

## 2022-11-15 ENCOUNTER — Other Ambulatory Visit: Payer: Self-pay

## 2022-11-15 ENCOUNTER — Encounter (HOSPITAL_BASED_OUTPATIENT_CLINIC_OR_DEPARTMENT_OTHER): Payer: Self-pay

## 2022-11-15 DIAGNOSIS — G43819 Other migraine, intractable, without status migrainosus: Secondary | ICD-10-CM | POA: Diagnosis not present

## 2022-11-15 DIAGNOSIS — R519 Headache, unspecified: Secondary | ICD-10-CM | POA: Diagnosis present

## 2022-11-15 MED ORDER — PROCHLORPERAZINE EDISYLATE 10 MG/2ML IJ SOLN
10.0000 mg | Freq: Once | INTRAMUSCULAR | Status: AC
  Administered 2022-11-15: 10 mg via INTRAVENOUS
  Filled 2022-11-15: qty 2

## 2022-11-15 MED ORDER — SODIUM CHLORIDE 0.9 % IV BOLUS
1000.0000 mL | Freq: Once | INTRAVENOUS | Status: AC
  Administered 2022-11-15: 1000 mL via INTRAVENOUS

## 2022-11-15 MED ORDER — KETOROLAC TROMETHAMINE 30 MG/ML IJ SOLN
30.0000 mg | Freq: Once | INTRAMUSCULAR | Status: AC
  Administered 2022-11-15: 30 mg via INTRAVENOUS
  Filled 2022-11-15: qty 1

## 2022-11-15 NOTE — ED Triage Notes (Signed)
POV from home, A&O x 4, GCS 15, amb to triage  C/o headache x 3 days, denies N/V, photosensitivity, hx of migraines.

## 2022-11-15 NOTE — ED Provider Notes (Signed)
[facility] AT [facility]  Provider Note  CSN: [id number] Arrival date & time: 11/15/22 0211  History Chief Complaint  Patient presents with   Headache    Robin Fuller is a 27 y.o. female with history of migraines reports 3 days of her typical migraine headache, associated with photophobia and nausea, not relieved with home OTC meds. No fever. Does not think she is pregnant.    Home Medications Prior to Admission medications   Medication Sig Start Date End Date Taking? Authorizing Provider  amoxicillin-clavulanate (AUGMENTIN) 875-125 MG tablet Take 1 tablet by mouth every 12 (twelve) hours. 02/27/22   Palumbo, April, MD  colchicine 0.6 MG tablet Take 1 tablet (0.6 mg total) by mouth 2 (two) times daily. 11/17/20   Ghimire, Werner Lean, MD  diphenhydrAMINE (BENADRYL) 25 MG tablet Take 1 tablet (25 mg total) by mouth every 6 (six) hours as needed. 03/14/21   Curatolo, Adam, DO  mesalamine (LIALDA) 1.2 g EC tablet Take 2 tablets (2.4 g total) by mouth daily with breakfast. 11/17/20 01/16/21  Ghimire, Werner Lean, MD  oxyCODONE 10 MG TABS Take 1 tablet (10 mg total) by mouth every 8 (eight) hours as needed for up to 10 doses for breakthrough pain. 03/22/22   Curatolo, Adam, DO  oxyCODONE-acetaminophen (PERCOCET) 5-325 MG tablet Take 1-2 tablets by mouth every 6 (six) hours as needed. 03/01/22   Geoffery Lyons, MD  pantoprazole (PROTONIX) 40 MG tablet Take 1 tablet (40 mg total) by mouth daily at 12 noon. 11/17/20   Ghimire, Werner Lean, MD  predniSONE (DELTASONE) 20 MG tablet 3 Tabs PO Days 1-3, then 2 tabs PO Days 4-6, then 1 tab PO Day 7-9, then Half Tab PO Day 10-12 03/01/22   Geoffery Lyons, MD     Allergies    Prednisone, Fentanyl, Hydrocodone-acetaminophen, and Morphine and codeine   Review of Systems   Review of Systems Please see HPI for pertinent positives and negatives  Physical Exam BP 117/76   Pulse 76   Temp 98.6 F (37 C)   Resp 18    Ht 5\' 5"  (1.651 m)   Wt 72.6 kg   LMP 09/29/2022   SpO2 100%   BMI 26.63 kg/m   Physical Exam Vitals and nursing note reviewed.  HENT:     Head: Normocephalic.     Nose: Nose normal.  Eyes:     Extraocular Movements: Extraocular movements intact.  Pulmonary:     Effort: Pulmonary effort is normal.  Musculoskeletal:        General: Normal range of motion.     Cervical back: Neck supple.  Skin:    Findings: No rash (on exposed skin).  Neurological:     General: No focal deficit present.     Mental Status: She is alert and oriented to person, place, and time.  Psychiatric:        Mood and Affect: Mood normal.     ED Results / Procedures / Treatments   EKG None  Procedures Procedures  Medications Ordered in the ED Medications  ketorolac (TORADOL) 30 MG/ML injection 30 mg (30 mg Intravenous Given 11/15/22 0420)  prochlorperazine (COMPAZINE) injection 10 mg (10 mg Intravenous Given 11/15/22 0421)  sodium chloride 0.9 % bolus 1,000 mL (1,000 mLs Intravenous New Bag/Given 11/15/22 0423)    Initial Impression and Plan  Patient here with her typical migraine. Will give a migraine cocktail, IVF and reassess.   ED Course   Clinical Course as  of 11/15/22 0441  Fri Nov 15, 2022  4098 Patient reports minimal improvement in headache after meds. She was offered additional migraine medications but she states she needs to leave to pick up her daughter. She is requesting dilaudid because she had that before when she had a crohn's flare. I advised that was not indicated for migraines. She was advised to try OTC medications such as Excedrin and PCP follow up, RTED for any other concerns.   [CS]    Clinical Course User Index [CS] Pollyann Savoy, MD     MDM Rules/Calculators/A&P Medical Decision Making Problems Addressed: Other migraine without status migrainosus, intractable: acute illness or injury  Risk Prescription drug management.     Final Clinical Impression(s) /  ED Diagnoses Final diagnoses:  Other migraine without status migrainosus, intractable    Rx / DC Orders ED Discharge Orders     None        Pollyann Savoy, MD 11/15/22 [phone]

## 2023-01-20 ENCOUNTER — Other Ambulatory Visit: Payer: Self-pay

## 2023-01-20 ENCOUNTER — Emergency Department (HOSPITAL_COMMUNITY)
Admission: EM | Admit: 2023-01-20 | Discharge: 2023-01-21 | Disposition: A | Discharge status: Home / Self Care | Payer: Medicaid Other | Attending: Emergency Medicine | Admitting: Emergency Medicine

## 2023-01-20 ENCOUNTER — Emergency Department (HOSPITAL_COMMUNITY): Payer: Medicaid Other

## 2023-01-20 ENCOUNTER — Encounter (HOSPITAL_COMMUNITY): Payer: Self-pay | Admitting: Emergency Medicine

## 2023-01-20 DIAGNOSIS — D72829 Elevated white blood cell count, unspecified: Secondary | ICD-10-CM | POA: Insufficient documentation

## 2023-01-20 DIAGNOSIS — O219 Vomiting of pregnancy, unspecified: Secondary | ICD-10-CM | POA: Insufficient documentation

## 2023-01-20 DIAGNOSIS — F1193 Opioid use, unspecified with withdrawal: Secondary | ICD-10-CM | POA: Diagnosis not present

## 2023-01-20 DIAGNOSIS — O99321 Drug use complicating pregnancy, first trimester: Secondary | ICD-10-CM | POA: Insufficient documentation

## 2023-01-20 DIAGNOSIS — Z3A01 Less than 8 weeks gestation of pregnancy: Secondary | ICD-10-CM | POA: Diagnosis not present

## 2023-01-20 DIAGNOSIS — O26891 Other specified pregnancy related conditions, first trimester: Secondary | ICD-10-CM | POA: Diagnosis not present

## 2023-01-20 LAB — URINALYSIS, W/ REFLEX TO CULTURE (INFECTION SUSPECTED)
Bacteria, UA: NONE SEEN
Bilirubin Urine: NEGATIVE
Glucose, UA: NEGATIVE mg/dL
Hgb urine dipstick: NEGATIVE
Ketones, ur: 20 mg/dL — AB
Leukocytes,Ua: NEGATIVE
Nitrite: NEGATIVE
Protein, ur: NEGATIVE mg/dL
Specific Gravity, Urine: 1.02 (ref 1.005–1.030)
pH: 6 (ref 5.0–8.0)

## 2023-01-20 LAB — CBC WITH DIFFERENTIAL/PLATELET
Abs Immature Granulocytes: 0.03 10*3/uL (ref 0.00–0.07)
Basophils Absolute: 0 10*3/uL (ref 0.0–0.1)
Basophils Relative: 0 %
Eosinophils Absolute: 0 10*3/uL (ref 0.0–0.5)
Eosinophils Relative: 0 %
HCT: 45 % (ref 36.0–46.0)
Hemoglobin: 14.2 g/dL (ref 12.0–15.0)
Immature Granulocytes: 0 %
Lymphocytes Relative: 8 %
Lymphs Abs: 0.9 10*3/uL (ref 0.7–4.0)
MCH: 23.9 pg — ABNORMAL LOW (ref 26.0–34.0)
MCHC: 31.6 g/dL (ref 30.0–36.0)
MCV: 75.6 fL — ABNORMAL LOW (ref 80.0–100.0)
Monocytes Absolute: 0.2 10*3/uL (ref 0.1–1.0)
Monocytes Relative: 2 %
Neutro Abs: 10.4 10*3/uL — ABNORMAL HIGH (ref 1.7–7.7)
Neutrophils Relative %: 90 %
Platelets: 344 10*3/uL (ref 150–400)
RBC: 5.95 MIL/uL — ABNORMAL HIGH (ref 3.87–5.11)
RDW: 17.3 % — ABNORMAL HIGH (ref 11.5–15.5)
WBC: 11.5 10*3/uL — ABNORMAL HIGH (ref 4.0–10.5)
nRBC: 0 % (ref 0.0–0.2)

## 2023-01-20 LAB — BASIC METABOLIC PANEL
Anion gap: 10 (ref 5–15)
BUN: 7 mg/dL (ref 6–20)
CO2: 20 mmol/L — ABNORMAL LOW (ref 22–32)
Calcium: 9.8 mg/dL (ref 8.9–10.3)
Chloride: 107 mmol/L (ref 98–111)
Creatinine, Ser: 0.51 mg/dL (ref 0.44–1.00)
GFR, Estimated: >60 mL/min (ref >60)
Glucose, Bld: 125 mg/dL — ABNORMAL HIGH (ref 70–99)
Potassium: 3.5 mmol/L (ref 3.5–5.1)
Sodium: 137 mmol/L (ref 135–145)

## 2023-01-20 LAB — I-STAT CHEM 8, ED
BUN: 5 mg/dL — ABNORMAL LOW (ref 6–20)
Calcium, Ion: 1.26 mmol/L (ref 1.15–1.40)
Chloride: 106 mmol/L (ref 98–111)
Creatinine, Ser: 0.6 mg/dL (ref 0.44–1.00)
Glucose, Bld: 119 mg/dL — ABNORMAL HIGH (ref 70–99)
HCT: 47 % — ABNORMAL HIGH (ref 36.0–46.0)
Hemoglobin: 16 g/dL — ABNORMAL HIGH (ref 12.0–15.0)
Potassium: 3.6 mmol/L (ref 3.5–5.1)
Sodium: 140 mmol/L (ref 135–145)
TCO2: 18 mmol/L — ABNORMAL LOW (ref 22–32)

## 2023-01-20 LAB — HCG, QUANTITATIVE, PREGNANCY: hCG, Beta Chain, Quant, S: 92361 m[IU]/mL — ABNORMAL HIGH (ref <5)

## 2023-01-20 LAB — WET PREP, GENITAL
Clue Cells Wet Prep HPF POC: NONE SEEN
Sperm: NONE SEEN
Trich, Wet Prep: NONE SEEN
WBC, Wet Prep HPF POC: <10 (ref <10)
Yeast Wet Prep HPF POC: NONE SEEN

## 2023-01-20 LAB — HEPATIC FUNCTION PANEL
ALT: 16 U/L (ref 0–44)
AST: 17 U/L (ref 15–41)
Albumin: 4.5 g/dL (ref 3.5–5.0)
Alkaline Phosphatase: 97 U/L (ref 38–126)
Bilirubin, Direct: 0.1 mg/dL (ref 0.0–0.2)
Indirect Bilirubin: 0.7 mg/dL (ref 0.3–0.9)
Total Bilirubin: 0.8 mg/dL (ref 0.3–1.2)
Total Protein: 8.5 g/dL — ABNORMAL HIGH (ref 6.5–8.1)

## 2023-01-20 LAB — LIPASE, BLOOD: Lipase: 27 U/L (ref 11–51)

## 2023-01-20 LAB — MAGNESIUM: Magnesium: 2.1 mg/dL (ref 1.7–2.4)

## 2023-01-20 LAB — TYPE AND SCREEN
ABO/RH(D): A POS
Antibody Screen: NEGATIVE

## 2023-01-20 LAB — PREGNANCY, URINE: Preg Test, Ur: POSITIVE — AB

## 2023-01-20 MED ORDER — HYDROMORPHONE HCL 1 MG/ML IJ SOLN
0.5000 mg | Freq: Once | INTRAMUSCULAR | Status: AC
  Administered 2023-01-20: 0.5 mg via INTRAVENOUS
  Filled 2023-01-20: qty 1

## 2023-01-20 MED ORDER — LACTATED RINGERS IV BOLUS
1000.0000 mL | Freq: Once | INTRAVENOUS | Status: AC
  Administered 2023-01-20: 1000 mL via INTRAVENOUS

## 2023-01-20 MED ORDER — METOCLOPRAMIDE HCL 5 MG/ML IJ SOLN
10.0000 mg | Freq: Once | INTRAMUSCULAR | Status: AC
  Administered 2023-01-20: 10 mg via INTRAVENOUS
  Filled 2023-01-20: qty 2

## 2023-01-20 MED ORDER — ONDANSETRON HCL 4 MG/2ML IJ SOLN
4.0000 mg | Freq: Once | INTRAMUSCULAR | Status: AC
  Administered 2023-01-20: 4 mg via INTRAVENOUS
  Filled 2023-01-20: qty 2

## 2023-01-20 MED ORDER — PROMETHAZINE HCL 25 MG RE SUPP: 25.0000 mg | Freq: Three times a day (TID) | RECTAL | 0 refills | Status: AC | PRN

## 2023-01-20 MED ORDER — PROMETHAZINE HCL 25 MG/ML IJ SOLN
12.5000 mg | Freq: Once | INTRAMUSCULAR | Status: AC
  Administered 2023-01-20: 12.5 mg via INTRAMUSCULAR
  Filled 2023-01-20: qty 1

## 2023-01-20 MED ORDER — DIPHENHYDRAMINE HCL 50 MG/ML IJ SOLN
12.5000 mg | Freq: Once | INTRAMUSCULAR | Status: AC
  Administered 2023-01-20: 12.5 mg via INTRAVENOUS
  Filled 2023-01-20: qty 1

## 2023-01-20 MED ORDER — PROMETHAZINE HCL 25 MG PO TABS: 25.0000 mg | ORAL_TABLET | Freq: Three times a day (TID) | ORAL | 0 refills | Status: AC | PRN

## 2023-01-20 NOTE — ED Notes (Signed)
Attempting to locate the bladder scanner in the department at this time.

## 2023-01-20 NOTE — ED Notes (Signed)
Advised pt's sister the importance of a urine sample, unable to provide due to lack of fluids

## 2023-01-20 NOTE — ED Triage Notes (Signed)
Patient presents due to vomiting and bloody stools the past 4 days, She reports a lot of blood in the stool. Today she reports and increase in pain with a syncopal episode in the shower. Abdominal pain is radiating into the chest.      HX: Ulcerative colitis  EMS vitals: 80 HR 140/80 BP 104 CBG 100% SPO2 on room air

## 2023-01-20 NOTE — ED Notes (Signed)
Pelvic Cart is outside room. Ultrasound is in the room at this time.

## 2023-01-20 NOTE — ED Provider Notes (Signed)
[place] [facility]ERGENC[facility] DEPARTMENT AT [facility] Provider Note   CSN: [id number] Arrival date & time: 01/20/23  1538     History  Chief Complaint  Patient presents with   GI Bleeding   Emesis    Robin Fuller is a 27 y.o. female.   Emesis Associated symptoms: abdominal pain   Patient presents for abdominal pain, nausea, vomiting, and bloody stools.  Medical history includes IBD, anemia, migraines.  Onset of symptoms was 4 days ago.  Pain has been located in lower abdomen.  It is not greater on one side.  She has had nausea, vomiting, and p.o. intolerance for the past 4 days.  She has not been able to keep anything down, including fluids and medicine.  Current pain is 10/10 in severity.  She describes bright red bloody stools with no associated pain in rectal area.  Earlier today, she had a syncopal episode while in shower.  This prompted her call to EMS.  She currently does not take any medications for management of her IBD.  She denies current pregnancy.       Home Medications Prior to Admission medications   Medication Sig Start Date End Date Taking? Authorizing Provider  LINZESS 72 MCG capsule Take 72 mcg by mouth daily as needed (for constipation).   Yes [provider]  ondansetron (ZOFRAN-ODT) 8 MG disintegrating tablet Take 8 mg by mouth 2 (two) times daily as needed for nausea or vomiting (dissolve orally). 12/31/22  Yes [provider]  oxyCODONE 10 MG TABS Take 1 tablet (10 mg total) by mouth every 8 (eight) hours as needed for up to 10 doses for breakthrough pain. Patient taking differently: Take 10 mg by mouth 3 (three) times daily. 03/22/22  Yes Curatolo, Adam, DO  promethazine (PHENERGAN) 25 MG suppository Place 1 suppository (25 mg total) rectally every 8 (eight) hours as needed for nausea or vomiting. 01/20/23  Yes Gloris Manchester, MD  promethazine (PHENERGAN) 25 MG tablet Take 1 tablet (25 mg total) by mouth every 8 (eight) hours as  needed for nausea or vomiting. 01/20/23  Yes Gloris Manchester, MD  Vitamin D, Ergocalciferol, (DRISDOL) 1.25 MG (50000 UNIT) CAPS capsule Take 50,000 Units by mouth See admin instructions. Take 50,000 units by mouth once a week if deficient   Yes [provider]  amoxicillin-clavulanate (AUGMENTIN) 875-125 MG tablet Take 1 tablet by mouth every 12 (twelve) hours. Patient not taking: Reported on 01/20/2023 02/27/22   Palumbo, April, MD  colchicine 0.6 MG tablet Take 1 tablet (0.6 mg total) by mouth 2 (two) times daily. Patient not taking: Reported on 01/20/2023 11/17/20   Maretta Bees, MD  diphenhydrAMINE (BENADRYL) 25 MG tablet Take 1 tablet (25 mg total) by mouth every 6 (six) hours as needed. Patient not taking: Reported on 01/20/2023 03/14/21   Virgina Norfolk, DO  mesalamine (LIALDA) 1.2 g EC tablet Take 2 tablets (2.4 g total) by mouth daily with breakfast. Patient not taking: Reported on 01/20/2023 11/17/20 01/20/23  Maretta Bees, MD  oxyCODONE-acetaminophen (PERCOCET) 5-325 MG tablet Take 1-2 tablets by mouth every 6 (six) hours as needed. Patient not taking: Reported on 01/20/2023 03/01/22   Geoffery Lyons, MD  pantoprazole (PROTONIX) 40 MG tablet Take 1 tablet (40 mg total) by mouth daily at 12 noon. Patient not taking: Reported on 01/20/2023 11/17/20   Maretta Bees, MD  predniSONE (DELTASONE) 20 MG tablet 3 Tabs PO Days 1-3, then 2 tabs PO Days 4-6, then 1 tab PO  Day 7-9, then Half Tab PO Day 10-12 Patient not taking: Reported on 01/20/2023 03/01/22   Geoffery Lyons, MD      Allergies    Acetaminophen, Prednisone, Avocado, Fentanyl, Hydrocodone-acetaminophen, Ibuprofen, Naproxen sodium, and Morphine and codeine    Review of Systems   Review of Systems  Constitutional:  Positive for fatigue.  Gastrointestinal:  Positive for abdominal pain, blood in stool, nausea and vomiting.  Neurological:  Positive for syncope and weakness (Generalized).  All other systems reviewed  and are negative.   Physical Exam Updated Vital Signs BP 120/70   Pulse 68   Temp 99 F (37.2 C) (Oral)   Resp 20   SpO2 100%  Physical Exam Vitals and nursing note reviewed.  Constitutional:      General: She is not in acute distress.    Appearance: Normal appearance. She is well-developed. She is not ill-appearing, toxic-appearing or diaphoretic.  HENT:     Head: Normocephalic and atraumatic.     Right Ear: External ear normal.     Left Ear: External ear normal.     Nose: Nose normal.     Mouth/Throat:     Mouth: Mucous membranes are moist.  Eyes:     Extraocular Movements: Extraocular movements intact.     Conjunctiva/sclera: Conjunctivae normal.  Cardiovascular:     Rate and Rhythm: Normal rate and regular rhythm.     Heart sounds: No murmur heard. Pulmonary:     Effort: Pulmonary effort is normal. No respiratory distress.     Breath sounds: Normal breath sounds. No wheezing or rales.  Chest:     Chest wall: No tenderness.  Abdominal:     General: There is no distension.     Palpations: Abdomen is soft.     Tenderness: There is abdominal tenderness. There is right CVA tenderness and left CVA tenderness. There is no guarding or rebound.  Musculoskeletal:        General: No swelling. Normal range of motion.     Cervical back: Normal range of motion and neck supple.     Right lower leg: No edema.     Left lower leg: No edema.  Skin:    General: Skin is warm and dry.     Coloration: Skin is not jaundiced or pale.  Neurological:     General: No focal deficit present.     Mental Status: She is alert and oriented to person, place, and time.  Psychiatric:        Mood and Affect: Mood normal.        Behavior: Behavior normal.     ED Results / Procedures / Treatments   Labs (all labs ordered are listed, but only abnormal results are displayed) Labs Reviewed  CBC WITH DIFFERENTIAL/PLATELET - Abnormal; Notable for the following components:      Result Value   WBC  11.5 (*)    RBC 5.95 (*)    MCV 75.6 (*)    MCH 23.9 (*)    RDW 17.3 (*)    Neutro Abs 10.4 (*)    All other components within normal limits  PREGNANCY, URINE - Abnormal; Notable for the following components:   Preg Test, Ur POSITIVE (*)    All other components within normal limits  BASIC METABOLIC PANEL - Abnormal; Notable for the following components:   CO2 20 (*)    Glucose, Bld 125 (*)    All other components within normal limits  HEPATIC FUNCTION PANEL - Abnormal; Notable  for the following components:   Total Protein 8.5 (*)    All other components within normal limits  URINALYSIS, W/ REFLEX TO CULTURE (INFECTION SUSPECTED) - Abnormal; Notable for the following components:   Ketones, ur 20 (*)    All other components within normal limits  HCG, QUANTITATIVE, PREGNANCY - Abnormal; Notable for the following components:   hCG, Beta Chain, Quant, S 92,361 (*)    All other components within normal limits  I-STAT CHEM 8, ED - Abnormal; Notable for the following components:   BUN 5 (*)    Glucose, Bld 119 (*)    TCO2 18 (*)    Hemoglobin 16.0 (*)    HCT 47.0 (*)    All other components within normal limits  WET PREP, GENITAL  LIPASE, BLOOD  MAGNESIUM  HCG, SERUM, QUALITATIVE  TYPE AND SCREEN  ABO/RH  GC/CHLAMYDIA PROBE AMP ([place]) NOT AT [facility]    EKG EKG Interpretation Date/Time:  Monday January 20 2023 17:00:01 EDT Ventricular Rate:  74 PR Interval:  144 QRS Duration:  91 QT Interval:  372 QTC Calculation: 413 R Axis:   6  Text Interpretation: Sinus rhythm Borderline T abnormalities, anterior leads Confirmed by Gloris Manchester ([id number]) on 01/20/2023 5:41:25 PM  Radiology US OB Comp < 14 Wks  Result Date: 01/20/2023 CLINICAL DATA:  syncope EXAM: OBSTETRIC <14 WK ULTRASOUND; TRANSVAGINAL OB ULTRASOUND TECHNIQUE: Transabdominal and transvaginal OB ultrasound was performed for complete evaluation of the gestation as well as the maternal uterus, adnexal regions, and  pelvic cul-de-sac. COMPARISON:  None Available. FINDINGS: Intrauterine gestational sac: Single Yolk sac:  Visualized. Embryo:  Visualized. Cardiac Activity: Visualized. Heart Rate: 125 bpm CRL:   0.8 cm = 6 weeks 5 days [place] EDC: 09/10/2023 Subchorionic hemorrhage: Small subchorionic hematoma measuring 1.4 x 0.8 x 0.3 cm. Adnexa: No masses or fluid collections. Left-sided 2.7 cm corpus luteum. IMPRESSION: 1. Single viable intrauterine pregnancy measuring 6 weeks 5 days with ultrasound EDC of 09/10/2023. 2. Small subchorionic hematoma. 3. No adnexal pathology. Electronically Signed   By: Layla Maw M.D.   On: 01/20/2023 21:57   US OB Transvaginal  Result Date: 01/20/2023 CLINICAL DATA:  syncope EXAM: OBSTETRIC <14 WK ULTRASOUND; TRANSVAGINAL OB ULTRASOUND TECHNIQUE: Transabdominal and transvaginal OB ultrasound was performed for complete evaluation of the gestation as well as the maternal uterus, adnexal regions, and pelvic cul-de-sac. COMPARISON:  None Available. FINDINGS: Intrauterine gestational sac: Single Yolk sac:  Visualized. Embryo:  Visualized. Cardiac Activity: Visualized. Heart Rate: 125 bpm CRL:   0.8 cm = 6 weeks 5 days [place] EDC: 09/10/2023 Subchorionic hemorrhage: Small subchorionic hematoma measuring 1.4 x 0.8 x 0.3 cm. Adnexa: No masses or fluid collections. Left-sided 2.7 cm corpus luteum. IMPRESSION: 1. Single viable intrauterine pregnancy measuring 6 weeks 5 days with ultrasound EDC of 09/10/2023. 2. Small subchorionic hematoma. 3. No adnexal pathology. Electronically Signed   By: Layla Maw M.D.   On: 01/20/2023 21:57    Procedures Procedures    Medications Ordered in ED Medications  metoCLOPramide (REGLAN) injection 10 mg (10 mg Intravenous Given 01/20/23 1653)  HYDROmorphone (DILAUDID) injection 0.5 mg (0.5 mg Intravenous Given 01/20/23 1653)  lactated ringers bolus 1,000 mL (0 mLs Intravenous Stopped 01/20/23 1926)  diphenhydrAMINE (BENADRYL) injection 12.5 mg (12.5 mg  Intravenous Given 01/20/23 1649)  HYDROmorphone (DILAUDID) injection 0.5 mg (0.5 mg Intravenous Given 01/20/23 1812)  lactated ringers bolus 1,000 mL (0 mLs Intravenous Stopped 01/20/23 2158)  ondansetron (ZOFRAN) injection 4 mg (4 mg Intravenous Given  01/20/23 2038)  promethazine (PHENERGAN) injection 12.5 mg (12.5 mg Intramuscular Given 01/20/23 2217)  HYDROmorphone (DILAUDID) injection 0.5 mg (0.5 mg Intravenous Given 01/20/23 2306)    ED Course/ Medical Decision Making/ A&P                                 Medical Decision Making Amount and/or Complexity of Data Reviewed Labs: ordered. Radiology: ordered.  Risk Prescription drug management.   This patient presents to the ED for concern of abdominal pain, nausea, vomiting, this involves an extensive number of treatment options, and is a complaint that carries with it a high risk of complications and morbidity.  The differential diagnosis includes enteritis, colitis, UTI, nephrolithiasis, PID, pregnancy, medication withdrawal   Co morbidities that complicate the patient evaluation  IBD, anemia, migraines   Additional history obtained:  Additional history obtained from N/A External records from outside source obtained and reviewed including EMR   Lab Tests:  I Ordered, and personally interpreted labs.  The pertinent results include: Normal hemoglobin, mild leukocytosis, normal kidney function, normal electrolytes, no evidence of UTI, elevated hCG consistent with respiratory pregnancy   Imaging Studies ordered:  I ordered imaging studies including pelvic ultrasound I independently visualized and interpreted imaging which showed single IUP with small subchorionic hematoma I agree with the radiologist interpretation   Cardiac Monitoring: / EKG:  The patient was maintained on a cardiac monitor.  I personally viewed and interpreted the cardiac monitored which showed an underlying rhythm of: Sinus rhythm   Problem List /  ED Course / Critical interventions / Medication management  Patient presenting for abdominal pain, nausea, vomiting, p.o. intolerance, bloody stools for the past 4 days.  Vital signs on arrival are normal.  Patient is overall well-appearing on exam.  She does appear uncomfortable.  She describes a 10/10 severity pain in her lower abdomen.  Per chart review, she had a positive urine pregnancy test 3 days ago.  She denies current pregnancy.  She denies giving a urine sample last week.  Will check for pregnancy today in addition to other lab work.  IV fluids were ordered for hydration.  Quantitative hCG had prolonged result time.  Plan was for urine pregnancy test.  Patient reported that she was unable to urinate.  On bedside ultrasound, she had approximately 100 cc of urine in her bladder.  Additional IV fluids were ordered.  On this bladder ultrasound, it does appear that she has a IUP.  She adamantly denies possibility of pregnancy.  She reports that her LMP was 3 weeks ago.  hCG was 92,000.  Patient was informed that she is certainly pregnant.  Last period before the bleeding episode she had 3 weeks ago was first week of September.  I suspect 8-week gestational age.  Emesis likely secondary to first trimester pregnancy.  She did request pelvic exam.  Ultrasound showed IUP with heart rate of 125 and small subchorionic hematoma.  Urinalysis did not show infection.  On further discussion with the patient, she reports that she is on 10 mg oxycodone 3 times daily and has been for a long time.  I suspect that her recent nausea and vomiting has been exacerbated by her p.o. intolerance.  I suspect that her abdominal pain is secondary to opiate withdrawal.  Risks of opiate use in pregnancy were discussed with the patient.  Patient does wish to wean off of her opiates.  She is agreeable  to pelvic exam here in the ED.  She requests additional dose of pain medication prior to.  This was ordered.  On pelvic exam, there was no  vaginal blood present.  There was no clear evidence of infection.  GC/chlamydia and wet prep swabs were sent.  On DRE, there is no hemorrhoids, there is no blood.  Patient had resolution of nausea with Phenergan.  This will be prescribed.  Blood prep was negative.  Patient was discharged in stable condition. I ordered medication including Reglan, Zofran, Phenergan for nausea; IV fluids for hydration; Dilaudid for analgesia Reevaluation of the patient after these medicines showed that the patient improved I have reviewed the patients home medicines and have made adjustments as needed   Social Determinants of Health:  Chronic opiate use, has access to outpatient care        Final Clinical Impression(s) / ED Diagnoses Final diagnoses:  Vomiting of pregnancy  Opiate withdrawal (HCC)    Rx / DC Orders ED Discharge Orders          Ordered    promethazine (PHENERGAN) 25 MG tablet  Every 8 hours PRN        01/20/23 2328    promethazine (PHENERGAN) 25 MG suppository  Every 8 hours PRN        01/20/23 2328              Gloris Manchester, MD 01/21/23 [id number]

## 2023-01-20 NOTE — Discharge Instructions (Addendum)
Test results today are reassuring.  You will need to follow-up with your OB/GYN for your new pregnancy.  There is a telephone number below that you can call to set up an appointment with the [facility] if needed.  Talk to them about weaning off of your opiate medication.  You should follow-up with your gastroenterologist as well.  There was a prescription for Phenergan sent to your pharmacy.  Take this as needed for nausea and vomiting.  Stay hydrated.  Return to the emergency department for any new or worsening symptoms of concern.

## 2023-01-21 LAB — GC/CHLAMYDIA PROBE AMP (~~LOC~~) NOT AT ARMC
Chlamydia: NEGATIVE
Comment: NEGATIVE
Comment: NORMAL
Neisseria Gonorrhea: NEGATIVE

## 2023-01-23 ENCOUNTER — Other Ambulatory Visit: Payer: Self-pay

## 2023-01-23 ENCOUNTER — Encounter (HOSPITAL_BASED_OUTPATIENT_CLINIC_OR_DEPARTMENT_OTHER): Payer: Self-pay | Admitting: Emergency Medicine

## 2023-01-23 ENCOUNTER — Emergency Department (HOSPITAL_BASED_OUTPATIENT_CLINIC_OR_DEPARTMENT_OTHER)
Admission: EM | Admit: 2023-01-23 | Discharge: 2023-01-24 | Disposition: A | Discharge status: Home / Self Care | Payer: Medicaid Other | Attending: Emergency Medicine | Admitting: Emergency Medicine

## 2023-01-23 DIAGNOSIS — O219 Vomiting of pregnancy, unspecified: Secondary | ICD-10-CM | POA: Insufficient documentation

## 2023-01-23 DIAGNOSIS — D72829 Elevated white blood cell count, unspecified: Secondary | ICD-10-CM | POA: Diagnosis not present

## 2023-01-23 LAB — CBC WITH DIFFERENTIAL/PLATELET
Abs Immature Granulocytes: 0.04 10*3/uL (ref 0.00–0.07)
Basophils Absolute: 0 10*3/uL (ref 0.0–0.1)
Basophils Relative: 0 %
Eosinophils Absolute: 0 10*3/uL (ref 0.0–0.5)
Eosinophils Relative: 0 %
HCT: 43.2 % (ref 36.0–46.0)
Hemoglobin: 14.2 g/dL (ref 12.0–15.0)
Immature Granulocytes: 0 %
Lymphocytes Relative: 21 %
Lymphs Abs: 2.7 10*3/uL (ref 0.7–4.0)
MCH: 24 pg — ABNORMAL LOW (ref 26.0–34.0)
MCHC: 32.9 g/dL (ref 30.0–36.0)
MCV: 73.1 fL — ABNORMAL LOW (ref 80.0–100.0)
Monocytes Absolute: 1 10*3/uL (ref 0.1–1.0)
Monocytes Relative: 8 %
Neutro Abs: 8.8 10*3/uL — ABNORMAL HIGH (ref 1.7–7.7)
Neutrophils Relative %: 71 %
Platelets: 324 10*3/uL (ref 150–400)
RBC: 5.91 MIL/uL — ABNORMAL HIGH (ref 3.87–5.11)
RDW: 15.9 % — ABNORMAL HIGH (ref 11.5–15.5)
WBC: 12.6 10*3/uL — ABNORMAL HIGH (ref 4.0–10.5)
nRBC: 0 % (ref 0.0–0.2)

## 2023-01-23 MED ORDER — METHYLPREDNISOLONE SODIUM SUCC 125 MG IJ SOLR
125.0000 mg | INTRAMUSCULAR | Status: AC
  Administered 2023-01-24: 125 mg via INTRAVENOUS
  Filled 2023-01-23: qty 2

## 2023-01-23 MED ORDER — ONDANSETRON HCL 4 MG/2ML IJ SOLN
4.0000 mg | Freq: Once | INTRAMUSCULAR | Status: AC
  Administered 2023-01-23: 4 mg via INTRAVENOUS
  Filled 2023-01-23: qty 2

## 2023-01-23 MED ORDER — DIPHENHYDRAMINE HCL 50 MG/ML IJ SOLN
25.0000 mg | Freq: Once | INTRAMUSCULAR | Status: AC
  Administered 2023-01-23: 25 mg via INTRAVENOUS
  Filled 2023-01-23: qty 1

## 2023-01-23 NOTE — ED Triage Notes (Signed)
Pt states unable to eat or drink for 1 week, due to emesis, also has blood in stool, states has HX of Chrons and Ulcerative Colitis, here due to pain,

## 2023-01-23 NOTE — ED Notes (Signed)
Patient asked by MD for pregnancy status and she stated 'nah I got my period so I can't be pregnant'.  Patient was seen several days ago and was positive for pregnancy.

## 2023-01-24 DIAGNOSIS — O039 Complete or unspecified spontaneous abortion without complication: Secondary | ICD-10-CM

## 2023-01-24 HISTORY — DX: Complete or unspecified spontaneous abortion without complication: O03.9

## 2023-01-24 LAB — COMPREHENSIVE METABOLIC PANEL
ALT: 31 U/L (ref 0–44)
AST: 20 U/L (ref 15–41)
Albumin: 3.8 g/dL (ref 3.5–5.0)
Alkaline Phosphatase: 87 U/L (ref 38–126)
Anion gap: 10 (ref 5–15)
BUN: 9 mg/dL (ref 6–20)
CO2: 20 mmol/L — ABNORMAL LOW (ref 22–32)
Calcium: 8.9 mg/dL (ref 8.9–10.3)
Chloride: 101 mmol/L (ref 98–111)
Creatinine, Ser: 0.55 mg/dL (ref 0.44–1.00)
GFR, Estimated: >60 mL/min (ref >60)
Glucose, Bld: 94 mg/dL (ref 70–99)
Potassium: 3.2 mmol/L — ABNORMAL LOW (ref 3.5–5.1)
Sodium: 131 mmol/L — ABNORMAL LOW (ref 135–145)
Total Bilirubin: 1.1 mg/dL (ref 0.3–1.2)
Total Protein: 7.5 g/dL (ref 6.5–8.1)

## 2023-01-24 LAB — OCCULT BLOOD X 1 CARD TO LAB, STOOL: Fecal Occult Bld: NEGATIVE

## 2023-01-24 LAB — HCG, QUANTITATIVE, PREGNANCY: hCG, Beta Chain, Quant, S: 132875 m[IU]/mL — ABNORMAL HIGH (ref <5)

## 2023-01-24 MED ORDER — ONDANSETRON 8 MG PO TBDP: ORAL_TABLET | ORAL | 0 refills | Status: AC

## 2023-01-24 MED ORDER — MORPHINE SULFATE (PF) 2 MG/ML IV SOLN
2.0000 mg | Freq: Once | INTRAVENOUS | Status: AC
  Administered 2023-01-24: 2 mg via INTRAVENOUS
  Filled 2023-01-24: qty 1

## 2023-01-24 NOTE — ED Provider Notes (Addendum)
With nurse Tiiu present patient informed me she could not be pregnant as she had a period in both September and October.  I had already reviewed chart that there were multiple positive pregnancy tests in our healthcare system.    I repeated the quant, I went and informed patient that she was pregnant and she states she wasn't and I informed the patient that she was approximately [redacted] weeks pregnant and had other positive tests in our system when she was seen for abdominal pain and vomiting within the past week.  She now denies being seen. According to care everywhere she has been seen by her OB for pregnancy as well. I explained that her vomiting is most likely due to pregnancy and there has been no vomiting here.  There was no blood on rectal exam.  Patient is stable for discharge.  I believe there is a component of drug seeking behavior.     Patient has had no emesis here.  Moist mucus membranes, no tachycardia.  No hypotension.  Has home narcotics for pain.      Robin Corriveau, MD 01/24/23 0981

## 2023-01-24 NOTE — ED Provider Notes (Signed)
[place] [facility] Provider Note   CSN: [id number] Arrival date & time: 01/23/23  2305     History  Chief Complaint  Patient presents with   Abdominal Pain    Robin Fuller is a 27 y.o. female.  The history is provided by the patient.  Abdominal Pain Pain location:  Generalized Pain radiates to:  Does not radiate Pain severity:  Severe Onset quality:  Gradual Timing:  Constant Progression:  Unchanged Associated symptoms: nausea and vomiting   Associated symptoms: no dysuria and no fever   Patient with IBD and positive pregnancy test in [facility] from the past week presents with pain nausea and vomiting.  Previous US from 01/20/23 demonstrates an IUP.      Past Medical History:  Diagnosis Date   Crohn's disease (HCC)    Frequent headaches    Migraine    Ulcerative colitis (HCC)      Home Medications Prior to Admission medications   Medication Sig Start Date End Date Taking? Authorizing Provider  amoxicillin-clavulanate (AUGMENTIN) 875-125 MG tablet Take 1 tablet by mouth every 12 (twelve) hours. Patient not taking: Reported on 01/20/2023 02/27/22   Britlee Skolnik, MD  colchicine 0.6 MG tablet Take 1 tablet (0.6 mg total) by mouth 2 (two) times daily. Patient not taking: Reported on 01/20/2023 11/17/20   Maretta Bees, MD  diphenhydrAMINE (BENADRYL) 25 MG tablet Take 1 tablet (25 mg total) by mouth every 6 (six) hours as needed. Patient not taking: Reported on 01/20/2023 03/14/21   Virgina Norfolk, DO  LINZESS 72 MCG capsule Take 72 mcg by mouth daily as needed (for constipation).    [provider]  mesalamine (LIALDA) 1.2 g EC tablet Take 2 tablets (2.4 g total) by mouth daily with breakfast. Patient not taking: Reported on 01/20/2023 11/17/20 01/20/23  Maretta Bees, MD  ondansetron (ZOFRAN-ODT) 8 MG disintegrating tablet Take 8 mg by mouth 2 (two) times daily as needed for nausea or vomiting (dissolve  orally). 12/31/22   [provider]  oxyCODONE 10 MG TABS Take 1 tablet (10 mg total) by mouth every 8 (eight) hours as needed for up to 10 doses for breakthrough pain. Patient taking differently: Take 10 mg by mouth 3 (three) times daily. 03/22/22   Curatolo, Adam, DO  oxyCODONE-acetaminophen (PERCOCET) 5-325 MG tablet Take 1-2 tablets by mouth every 6 (six) hours as needed. Patient not taking: Reported on 01/20/2023 03/01/22   Geoffery Lyons, MD  pantoprazole (PROTONIX) 40 MG tablet Take 1 tablet (40 mg total) by mouth daily at 12 noon. Patient not taking: Reported on 01/20/2023 11/17/20   Maretta Bees, MD  predniSONE (DELTASONE) 20 MG tablet 3 Tabs PO Days 1-3, then 2 tabs PO Days 4-6, then 1 tab PO Day 7-9, then Half Tab PO Day 10-12 Patient not taking: Reported on 01/20/2023 03/01/22   Geoffery Lyons, MD  promethazine (PHENERGAN) 25 MG suppository Place 1 suppository (25 mg total) rectally every 8 (eight) hours as needed for nausea or vomiting. 01/20/23   Gloris Manchester, MD  promethazine (PHENERGAN) 25 MG tablet Take 1 tablet (25 mg total) by mouth every 8 (eight) hours as needed for nausea or vomiting. 01/20/23   Gloris Manchester, MD  Vitamin D, Ergocalciferol, (DRISDOL) 1.25 MG (50000 UNIT) CAPS capsule Take 50,000 Units by mouth See admin instructions. Take 50,000 units by mouth once a week if deficient    [provider]      Allergies  Acetaminophen, Prednisone, Avocado, Fentanyl, Hydrocodone-acetaminophen, Ibuprofen, Naproxen sodium, and Morphine and codeine    Review of Systems   Review of Systems  Constitutional:  Negative for fever.  HENT:  Negative for facial swelling.   Gastrointestinal:  Positive for abdominal pain, nausea and vomiting.  Genitourinary:  Negative for dysuria.  All other systems reviewed and are negative.   Physical Exam Updated Vital Signs BP 118/77   Pulse 91   Temp 98.3 F (36.8 C) (Oral)   Resp (!) 24   Ht 5\' 5"  (1.651 m)   Wt 71.8 kg    LMP 12/28/2022 (Exact Date)   SpO2 100%   BMI 26.34 kg/m  Physical Exam Vitals and nursing note reviewed.  Constitutional:      General: She is not in acute distress.    Appearance: Normal appearance. She is well-developed.  HENT:     Head: Normocephalic and atraumatic.     Nose: Nose normal.  Eyes:     Pupils: Pupils are equal, round, and reactive to light.  Cardiovascular:     Rate and Rhythm: Normal rate and regular rhythm.     Pulses: Normal pulses.     Heart sounds: Normal heart sounds.  Pulmonary:     Effort: Pulmonary effort is normal. No respiratory distress.     Breath sounds: Normal breath sounds.  Abdominal:     General: Bowel sounds are normal. There is no distension.     Palpations: Abdomen is soft.     Tenderness: There is no abdominal tenderness. There is no guarding or rebound.     Hernia: No hernia is present.  Genitourinary:    Vagina: No vaginal discharge.  Musculoskeletal:        General: Normal range of motion.     Cervical back: Normal range of motion and neck supple.  Skin:    General: Skin is warm and dry.     Capillary Refill: Capillary refill takes less than 2 seconds.     Findings: No erythema or rash.  Neurological:     General: No focal deficit present.     Mental Status: She is alert and oriented to person, place, and time.     Deep Tendon Reflexes: Reflexes normal.  Psychiatric:        Mood and Affect: Mood normal.     ED Results / Procedures / Treatments   Labs (all labs ordered are listed, but only abnormal results are displayed) Results for orders placed or performed during the hospital encounter of 01/23/23  CBC with Differential  Result Value Ref Range   WBC 12.6 (H) 4.0 - 10.5 K/uL   RBC 5.91 (H) 3.87 - 5.11 MIL/uL   Hemoglobin 14.2 12.0 - 15.0 g/dL   HCT 30.1 60.1 - 09.3 %   MCV 73.1 (L) 80.0 - 100.0 fL   MCH 24.0 (L) 26.0 - 34.0 pg   MCHC 32.9 30.0 - 36.0 g/dL   RDW 23.5 (H) 57.3 - 22.0 %   Platelets 324 150 - 400 K/uL    nRBC 0.0 0.0 - 0.2 %   Neutrophils Relative % 71 %   Neutro Abs 8.8 (H) 1.7 - 7.7 K/uL   Lymphocytes Relative 21 %   Lymphs Abs 2.7 0.7 - 4.0 K/uL   Monocytes Relative 8 %   Monocytes Absolute 1.0 0.1 - 1.0 K/uL   Eosinophils Relative 0 %   Eosinophils Absolute 0.0 0.0 - 0.5 K/uL   Basophils Relative 0 %   Basophils  Absolute 0.0 0.0 - 0.1 K/uL   Immature Granulocytes 0 %   Abs Immature Granulocytes 0.04 0.00 - 0.07 K/uL  Comprehensive metabolic panel  Result Value Ref Range   Sodium 131 (L) 135 - 145 mmol/L   Potassium 3.2 (L) 3.5 - 5.1 mmol/L   Chloride 101 98 - 111 mmol/L   CO2 20 (L) 22 - 32 mmol/L   Glucose, Bld 94 70 - 99 mg/dL   BUN 9 6 - 20 mg/dL   Creatinine, Ser 1.61 0.44 - 1.00 mg/dL   Calcium 8.9 8.9 - 09.6 mg/dL   Total Protein 7.5 6.5 - 8.1 g/dL   Albumin 3.8 3.5 - 5.0 g/dL   AST 20 15 - 41 U/L   ALT 31 0 - 44 U/L   Alkaline Phosphatase 87 38 - 126 U/L   Total Bilirubin 1.1 0.3 - 1.2 mg/dL   GFR, Estimated >04 >54 mL/min   Anion gap 10 5 - 15  hCG, quantitative, pregnancy  Result Value Ref Range   hCG, Beta Chain, Quant, S 132,875 (H) <5 mIU/mL  Occult blood card to lab, stool Provider will collect  Result Value Ref Range   Fecal Occult Bld NEGATIVE NEGATIVE   US OB Comp < 14 Wks  Result Date: 01/20/2023 CLINICAL DATA:  syncope EXAM: OBSTETRIC <14 WK ULTRASOUND; TRANSVAGINAL OB ULTRASOUND TECHNIQUE: Transabdominal and transvaginal OB ultrasound was performed for complete evaluation of the gestation as well as the maternal uterus, adnexal regions, and pelvic cul-de-sac. COMPARISON:  None Available. FINDINGS: Intrauterine gestational sac: Single Yolk sac:  Visualized. Embryo:  Visualized. Cardiac Activity: Visualized. Heart Rate: 125 bpm CRL:   0.8 cm = 6 weeks 5 days [place] EDC: 09/10/2023 Subchorionic hemorrhage: Small subchorionic hematoma measuring 1.4 x 0.8 x 0.3 cm. Adnexa: No masses or fluid collections. Left-sided 2.7 cm corpus luteum. IMPRESSION: 1. Single  viable intrauterine pregnancy measuring 6 weeks 5 days with ultrasound EDC of 09/10/2023. 2. Small subchorionic hematoma. 3. No adnexal pathology. Electronically Signed   By: Layla Maw M.D.   On: 01/20/2023 21:57   US OB Transvaginal  Result Date: 01/20/2023 CLINICAL DATA:  syncope EXAM: OBSTETRIC <14 WK ULTRASOUND; TRANSVAGINAL OB ULTRASOUND TECHNIQUE: Transabdominal and transvaginal OB ultrasound was performed for complete evaluation of the gestation as well as the maternal uterus, adnexal regions, and pelvic cul-de-sac. COMPARISON:  None Available. FINDINGS: Intrauterine gestational sac: Single Yolk sac:  Visualized. Embryo:  Visualized. Cardiac Activity: Visualized. Heart Rate: 125 bpm CRL:   0.8 cm = 6 weeks 5 days [place] EDC: 09/10/2023 Subchorionic hemorrhage: Small subchorionic hematoma measuring 1.4 x 0.8 x 0.3 cm. Adnexa: No masses or fluid collections. Left-sided 2.7 cm corpus luteum. IMPRESSION: 1. Single viable intrauterine pregnancy measuring 6 weeks 5 days with ultrasound EDC of 09/10/2023. 2. Small subchorionic hematoma. 3. No adnexal pathology. Electronically Signed   By: Layla Maw M.D.   On: 01/20/2023 21:57     Radiology No results found.  Procedures Procedures    Medications Ordered in ED Medications  morphine (PF) 2 MG/ML injection 2 mg (has no administration in time range)  ondansetron (ZOFRAN) injection 4 mg (4 mg Intravenous Given 01/23/23 2346)  diphenhydrAMINE (BENADRYL) injection 25 mg (25 mg Intravenous Given 01/23/23 2346)  methylPREDNISolone sodium succinate (SOLU-MEDROL) 125 mg/2 mL injection 125 mg (125 mg Intravenous Given 01/24/23 0000)    ED Course/ Medical Decision Making/ A&P  Medical Decision Making Patient with abdominal pain and nausea and vomiting   Amount and/or Complexity of Data Reviewed External Data Reviewed: labs, radiology and notes.    Details: Previous ED notes beta HCG and [place] reviewed  Labs:  ordered.    Details: Beta HCG 132, 875.  Fecal occult blood negative.  White count slight elevation 12.6, normal hemoglobin 14.2 normal platelets.  Sodium slight low 131, potassiums slight low 3.2, normal creatinine   Risk Prescription drug management. Risk Details: Sleeping soundly upon reassessment.  Guaiac is negative for bleeding. Exam is benign and reassuring.  No signs of a surgical abdomen.   Normal hemoglobin. Treated in the ED.  Vomiting is more likely related to pregnancy than to IBD.  Stable for discharge with close follow up by OB GYN strict return    Final Clinical Impression(s) / ED Diagnoses Final diagnoses:  Vomiting of pregnancy   Return for intractable cough, coughing up blood, fevers > 100.4 unrelieved by medication, shortness of breath, intractable vomiting, chest pain, shortness of breath, weakness, numbness, changes in speech, facial asymmetry, abdominal pain, passing out, Inability to tolerate liquids or food, cough, altered mental status or any concerns. No signs of systemic illness or infection. The patient is nontoxic-appearing on exam and vital signs are within normal limits.  I have reviewed the triage vital signs and the nursing notes. Pertinent labs & imaging results that were available during my care of the patient were reviewed by me and considered in my medical decision making (see chart for details). After history, exam, and medical workup I feel the patient has been appropriately medically screened and is safe for discharge home. Pertinent diagnoses were discussed with the patient. Patient was given return precautions.  Rx / DC Orders ED Discharge Orders     None         Comer Devins, MD 01/24/23 [phone]

## 2023-01-24 NOTE — ED Notes (Signed)
Pt unable to leave urine sample at this time.  

## 2023-03-11 ENCOUNTER — Inpatient Hospital Stay (HOSPITAL_BASED_OUTPATIENT_CLINIC_OR_DEPARTMENT_OTHER)
Admission: AD | Admit: 2023-03-11 | Discharge: 2023-03-11 | Disposition: A | Discharge status: Home / Self Care | Payer: Medicaid Other | Attending: Obstetrics and Gynecology | Admitting: Obstetrics and Gynecology

## 2023-03-11 ENCOUNTER — Other Ambulatory Visit: Payer: Self-pay

## 2023-03-11 ENCOUNTER — Inpatient Hospital Stay (HOSPITAL_COMMUNITY): Payer: Medicaid Other

## 2023-03-11 ENCOUNTER — Encounter (HOSPITAL_BASED_OUTPATIENT_CLINIC_OR_DEPARTMENT_OTHER): Payer: Self-pay

## 2023-03-11 DIAGNOSIS — O4690 Antepartum hemorrhage, unspecified, unspecified trimester: Secondary | ICD-10-CM

## 2023-03-11 DIAGNOSIS — Z3A01 Less than 8 weeks gestation of pregnancy: Secondary | ICD-10-CM | POA: Insufficient documentation

## 2023-03-11 DIAGNOSIS — O26899 Other specified pregnancy related conditions, unspecified trimester: Secondary | ICD-10-CM

## 2023-03-11 DIAGNOSIS — O3680X0 Pregnancy with inconclusive fetal viability, not applicable or unspecified: Secondary | ICD-10-CM | POA: Insufficient documentation

## 2023-03-11 DIAGNOSIS — O26891 Other specified pregnancy related conditions, first trimester: Secondary | ICD-10-CM | POA: Insufficient documentation

## 2023-03-11 DIAGNOSIS — O209 Hemorrhage in early pregnancy, unspecified: Secondary | ICD-10-CM | POA: Diagnosis not present

## 2023-03-11 DIAGNOSIS — R102 Pelvic and perineal pain: Secondary | ICD-10-CM

## 2023-03-11 DIAGNOSIS — R109 Unspecified abdominal pain: Secondary | ICD-10-CM | POA: Insufficient documentation

## 2023-03-11 DIAGNOSIS — Z9889 Other specified postprocedural states: Secondary | ICD-10-CM

## 2023-03-11 DIAGNOSIS — Z3A00 Weeks of gestation of pregnancy not specified: Secondary | ICD-10-CM

## 2023-03-11 LAB — CBC
HCT: 38 % (ref 36.0–46.0)
Hemoglobin: 11.9 g/dL — ABNORMAL LOW (ref 12.0–15.0)
MCH: 23.7 pg — ABNORMAL LOW (ref 26.0–34.0)
MCHC: 31.3 g/dL (ref 30.0–36.0)
MCV: 75.7 fL — ABNORMAL LOW (ref 80.0–100.0)
Platelets: 270 10*3/uL (ref 150–400)
RBC: 5.02 MIL/uL (ref 3.87–5.11)
RDW: 15.8 % — ABNORMAL HIGH (ref 11.5–15.5)
WBC: 9.2 10*3/uL (ref 4.0–10.5)
nRBC: 0 % (ref 0.0–0.2)

## 2023-03-11 LAB — PREGNANCY, URINE: Preg Test, Ur: POSITIVE — AB

## 2023-03-11 LAB — HCG, QUANTITATIVE, PREGNANCY: hCG, Beta Chain, Quant, S: 96 m[IU]/mL — ABNORMAL HIGH (ref <5)

## 2023-03-11 MED ORDER — OXYCODONE-ACETAMINOPHEN 5-325 MG PO TABS
1.0000 | ORAL_TABLET | Freq: Once | ORAL | Status: AC
  Administered 2023-03-11: 1 via ORAL
  Filled 2023-03-11: qty 1

## 2023-03-11 MED ORDER — ACETAMINOPHEN 500 MG PO TABS
1000.0000 mg | ORAL_TABLET | Freq: Once | ORAL | Status: AC
  Administered 2023-03-11: 1000 mg via ORAL
  Filled 2023-03-11: qty 2

## 2023-03-11 MED ORDER — OXYCODONE HCL 5 MG PO TABS
5.0000 mg | ORAL_TABLET | Freq: Once | ORAL | Status: AC
  Administered 2023-03-11: 5 mg via ORAL
  Filled 2023-03-11: qty 1

## 2023-03-11 NOTE — ED Notes (Addendum)
Report given to [facility] at [facility]

## 2023-03-11 NOTE — MAU Provider Note (Addendum)
Chief Complaint: Vaginal Bleeding   Event Date/Time   First Provider Initiated Contact with Patient 03/11/23 0451        SUBJECTIVE HPI: Robin Fuller is a 27 y.o. No obstetric history on file. at Unknown by LMP who presents to maternity admissions reporting vaginal bleeding and cramping.  Passed some clots and a gelatinous sac approximately 3-4inches long in shower.  Took a video but did not bring. . She denies urinary symptoms, dizziness, n/v, or fever/chills.    Gets care in [facility] with Dr Allena Katz but pt states he would not see her.   She had an elective medical abortion on 01/27/23.  They gave her Mifepristone in office (another [facility] office) and a Rx for the Misoprostil which she took in [place].  Tried to get followup exam at the [facility] office Allena Katz) but states they would not see her because she had the abortion in another office.  But she could not go back to other office so never had a followup US after EAB   She did go to ED in [place] a few days after EAB meds and states they told her the fetus was in the uterus but had no heartbeat.   States never passed the fetus or sac after that,  just had some bleeding.   Went to [facility] ED for bleeding and pain  They did bloodwork and gave her a Percocet and sent her here for ultrasound followup    Vaginal Bleeding The patient's primary symptoms include pelvic pain and vaginal bleeding. The current episode started today. The problem has been gradually improving. She is pregnant. Associated symptoms include abdominal pain and back pain. The vaginal discharge was bloody. She has been passing clots. She has been passing tissue. She has tried nothing for the symptoms.  Pelvic Pain The patient's primary symptoms include pelvic pain and vaginal bleeding. The current episode started today. The problem has been unchanged. Associated symptoms include abdominal pain and back pain.   RN Note .Robin Fuller is a 27 y.o. at Unknown  here in MAU reporting: here from [employer] - had pregnancy "the baby didn't have a heartbeat so they gave me medicine to pass it" 2 months ago. Reports 3 days ago started having heavy VB - saturating 4 maxi pads in 1 hour. Tonight started having intense pelvic pain. Took ibuprofen and tylenol - no relief. Took shower and passed a large clot that looked like tissue - states she has passed something similar after taking medication previously. Took meds at [facility] - no relief.  LMP: unsure - on DEPO  Onset of complaint: 3 days  Pain score: 10   Past Medical History:  Diagnosis Date   Crohn's disease (HCC)    Frequent headaches    Migraine    Miscarriage 01/2023   Ulcerative colitis (HCC)    History reviewed. No pertinent surgical history. Social History   Socioeconomic History   Marital status: Single    Spouse name: Not on file   Number of children: Not on file   Years of education: Not on file   Highest education level: Not on file  Occupational History   Not on file  Tobacco Use   Smoking status: Never   Smokeless tobacco: Never  Vaping Use   Vaping status: Never Used  Substance and Sexual Activity   Alcohol use: No   Drug use: No   Sexual activity: Yes    Birth control/protection: Injection  Other Topics Concern  Not on file  Social History Narrative   ** Merged History Encounter **       Social Drivers of Corporate investment banker Strain: Not on file  Food Insecurity: Not on file  Transportation Needs: Not on file  Physical Activity: Not on file  Stress: Not on file  Social Connections: Not on file  Intimate Partner Violence: Not on file   No current facility-administered medications on file prior to encounter.   Current Outpatient Medications on File Prior to Encounter  Medication Sig Dispense Refill   amoxicillin-clavulanate (AUGMENTIN) 875-125 MG tablet Take 1 tablet by mouth every 12 (twelve) hours. (Patient not taking: Reported on 01/20/2023) 14 tablet 0    colchicine 0.6 MG tablet Take 1 tablet (0.6 mg total) by mouth 2 (two) times daily. (Patient not taking: Reported on 01/20/2023) 60 tablet 0   diphenhydrAMINE (BENADRYL) 25 MG tablet Take 1 tablet (25 mg total) by mouth every 6 (six) hours as needed. (Patient not taking: Reported on 01/20/2023) 30 tablet 0   LINZESS 72 MCG capsule Take 72 mcg by mouth daily as needed (for constipation).     mesalamine (LIALDA) 1.2 g EC tablet Take 2 tablets (2.4 g total) by mouth daily with breakfast. (Patient not taking: Reported on 01/20/2023) 60 tablet 1   ondansetron (ZOFRAN-ODT) 8 MG disintegrating tablet Take 8 mg by mouth 2 (two) times daily as needed for nausea or vomiting (dissolve orally).     ondansetron (ZOFRAN-ODT) 8 MG disintegrating tablet 8mg  ODT q8 hours prn nausea 4 tablet 0   oxyCODONE 10 MG TABS Take 1 tablet (10 mg total) by mouth every 8 (eight) hours as needed for up to 10 doses for breakthrough pain. (Patient taking differently: Take 10 mg by mouth 3 (three) times daily.) 10 tablet 0   oxyCODONE-acetaminophen (PERCOCET) 5-325 MG tablet Take 1-2 tablets by mouth every 6 (six) hours as needed. (Patient not taking: Reported on 01/20/2023) 15 tablet 0   pantoprazole (PROTONIX) 40 MG tablet Take 1 tablet (40 mg total) by mouth daily at 12 noon. (Patient not taking: Reported on 01/20/2023) 30 tablet 0   predniSONE (DELTASONE) 20 MG tablet 3 Tabs PO Days 1-3, then 2 tabs PO Days 4-6, then 1 tab PO Day 7-9, then Half Tab PO Day 10-12 (Patient not taking: Reported on 01/20/2023) 20 tablet 0   promethazine (PHENERGAN) 25 MG suppository Place 1 suppository (25 mg total) rectally every 8 (eight) hours as needed for nausea or vomiting. 6 each 0   promethazine (PHENERGAN) 25 MG tablet Take 1 tablet (25 mg total) by mouth every 8 (eight) hours as needed for nausea or vomiting. 20 tablet 0   Vitamin D, Ergocalciferol, (DRISDOL) 1.25 MG (50000 UNIT) CAPS capsule Take 50,000 Units by mouth See admin  instructions. Take 50,000 units by mouth once a week if deficient     Allergies  Allergen Reactions   Acetaminophen Other (See Comments)    Patient has Crohn's Disease   Prednisone Other (See Comments)   Avocado Other (See Comments)    "Mouth goes numb"   Fentanyl Itching   Hydrocodone-Acetaminophen Nausea And Vomiting and Other (See Comments)    GI Intolerance   Ibuprofen Other (See Comments)    Inflammation due to Crohn's Disease   Naproxen Sodium Other (See Comments)    Inflammation due to Crohn's Disease   Morphine And Codeine Itching and Rash    I have reviewed patient's Past Medical Hx, Surgical Hx, Family Hx, Social  Hx, medications and allergies.   ROS:  Review of Systems  Gastrointestinal:  Positive for abdominal pain.  Genitourinary:  Positive for pelvic pain and vaginal bleeding.  Musculoskeletal:  Positive for back pain.   Review of Systems  Other systems negative   Physical Exam  Physical Exam Patient Vitals for the past 24 hrs:  BP Temp Pulse Resp SpO2 Height Weight  03/11/23 0335 124/72 -- 94 20 100 % -- --  03/11/23 0021 -- -- -- -- -- 5\' 5"  (1.651 m) 76.7 kg  03/11/23 0020 113/76 97.7 F (36.5 C) (!) 102 18 100 % -- --   Constitutional: Well-developed, well-nourished female in no acute distress.  Cardiovascular: normal rate Respiratory: normal effort GI: Abd soft, non-tender.  MS: Extremities nontender, no edema, normal ROM Neurologic: Alert and oriented x 4.  GU: Neg CVAT.  PELVIC EXAM: deferred in lieu of ultrasound  LAB RESULTS Results for orders placed or performed during the hospital encounter of 03/11/23 (from the past 24 hours)  CBC     Status: Abnormal   Collection Time: 03/11/23 12:26 AM  Result Value Ref Range   WBC 9.2 4.0 - 10.5 K/uL   RBC 5.02 3.87 - 5.11 MIL/uL   Hemoglobin 11.9 (L) 12.0 - 15.0 g/dL   HCT 10.9 60.4 - 54.0 %   MCV 75.7 (L) 80.0 - 100.0 fL   MCH 23.7 (L) 26.0 - 34.0 pg   MCHC 31.3 30.0 - 36.0 g/dL   RDW 98.1  (H) 19.1 - 15.5 %   Platelets 270 150 - 400 K/uL   nRBC 0.0 0.0 - 0.2 %  hCG, quantitative, pregnancy     Status: Abnormal   Collection Time: 03/11/23 12:26 AM  Result Value Ref Range   hCG, Beta Chain, Quant, S 96 (H) <5 mIU/mL  Pregnancy, urine     Status: Abnormal   Collection Time: 03/11/23  3:10 AM  Result Value Ref Range   Preg Test, Ur POSITIVE (A) NEGATIVE    --/--/A POS (10/28 1627)  IMAGING US OB Transvaginal Result Date: 03/11/2023 CLINICAL DATA:  Bleeding in early pregnancy. EXAM: TRANSVAGINAL OB ULTRASOUND TECHNIQUE: Transvaginal ultrasound was performed for complete evaluation of the gestation as well as the maternal uterus, adnexal regions, and pelvic cul-de-sac. COMPARISON:  None Available. FINDINGS: Intrauterine gestational sac: None Yolk sac:  Not Visualized. Embryo:  Not Visualized. Cardiac Activity: Not Visualized. Heart Rate: NA bpm Subchorionic hemorrhage:  None visualized. Maternal uterus/adnexae: Right ovary: Normal Left ovary: Normal Other :The endometrium appears heterogeneous measuring up to 9-10 mm in thickness. There is no increased vascularity within the endometrium. Free fluid:  No free fluid IMPRESSION: 1. No intrauterine gestational sac, yolk sac, or fetal pole identified. Findings meet definitive criteria for failed pregnancy. This follows SRU consensus guidelines: Diagnostic Criteria for Nonviable Pregnancy Early in the First Trimester. Macy Mis J Med [phone]. 2. Heterogeneous appearance of the endometrium which measures 9-10 mm without increased vascularity. Note: Retained products of conception can be suspected on ultrasound if the endometrial thickness is >10 mm or spontaneous abortion. Findings are therefore equivocal for retained products. Electronically Signed   By: Signa Kell M.D.   On: 03/11/2023 08:00     MAU Management/MDM: I have reviewed the triage vital signs and the nursing notes.   Pertinent labs & imaging results that were  available during my care of the patient were reviewed by me and considered in my medical decision making (see chart for details).  I have reviewed her medical records including past results, notes and treatments. Medical, Surgical, and family history were reviewed.  Medications and recent lab tests were reviewed  Ordered usual first trimester r/o ectopic labs.   Will check baseline Ultrasound to rule out ectopic.  Consult Dr Para March with presentation, exam findings, and results.   Treatments in MAU included [place], Percocet x 1 tablet.   This bleeding/pain can represent a normal pregnancy with bleeding, spontaneous abortion or even an ectopic which can be life-threatening.  The process as listed above helps to determine which of these is present.  Discussed findings We suspect the sac she passed at home was the fetus from the elective abortion but cannot absolutely exclude a new pregnancy of unknown location  We therefore recommend repeat HCG in 48 hours.  If it decreases then this is probably the completion of the abortion. If it rises, it may be a new pregnancy Patient prefers to do lab Thursday in Dr Eliane Decree office  ASSESSMENT 1. Vaginal bleeding in pregnancy   2.    Cramping  3.    Pregnancy of unknown location 4.    Recent elective medical abortion with unknown resolution 5.     Possible new pregnancy vs completion of former pregnancy  PLAN Discharge home Plan to repeat HCG level in 48 hours in Dr Eliane Decree office.  Informed her we can do it in our clinic.   Would repeat  Ultrasound in about 7-10 days if HCG levels double appropriately  Ectopic precautions Pt requested Rx for Percocet but records show she had 90 tab filled on 12/6, so I told her to contact that doctor  Pt stable at time of discharge. Encouraged to return here if she develops worsening of symptoms, increase in pain, fever, or other concerning symptoms.    Wynelle Bourgeois CNM, MSN Certified  Nurse-Midwife 03/11/2023  4:51 AM

## 2023-03-11 NOTE — ED Notes (Addendum)
ED Provider at bedside, speaking to patient about plan of care and need for eval by OB/GYN. Patient agrees with same

## 2023-03-11 NOTE — ED Notes (Signed)
Blue and purple top in lab

## 2023-03-11 NOTE — MAU Note (Addendum)
.  Robin Fuller is a 27 y.o. at Unknown here in MAU reporting: here from [employer] - had pregnancy "the baby didn't have a heartbeat so they gave me medicine to pass it" 2 months ago. Reports 3 days ago started having heavy VB - saturating 4 maxi pads in 1 hour. Tonight started having intense pelvic pain. Took ibuprofen and tylenol - no relief. Took shower and passed a large clot that looked like tissue - states she has passed something similar after taking medication previously. Took meds at [facility] - no relief.  LMP: unsure - on DEPO  Onset of complaint: 3 days  Pain score: 10  Vitals:   03/11/23 0335 03/11/23 0504  BP: 124/72 (!) 101/53  Pulse: 94 86  Resp: 20 19  Temp:  98.3 F (36.8 C)  SpO2: 100% 99%     FHT:NA  Lab orders placed from triage:  none

## 2023-03-11 NOTE — ED Notes (Signed)
ED Provider at bedside. 

## 2023-03-11 NOTE — ED Triage Notes (Signed)
Pt has had vaginal bleeding for for the last month after depo. For the last two days pt has been changing pads 4 times in one hour and is passing large clots. Pt having LLQ abd pain.Pt feeling fatigued. Pt was recently pregnant but she had a miscarriage.

## 2023-03-11 NOTE — ED Provider Notes (Signed)
[place] [facility] AT [facility] Provider Note   CSN: [id number] Arrival date & time: 03/11/23  0015     History  Chief Complaint  Patient presents with   Vaginal Bleeding    Robin Fuller is a 27 y.o. female.  27 yo F with a chief complaint of vaginal bleeding.  She was seen about a month ago for nonviable pregnancy.  She said she took a couple pills and passed some tissue.  Since then she has had some vaginal bleeding that was not that extensive until about 48 hours ago.  Started getting much more severe and having worsening pain.  She passed some large clots today while she was in the shower and came here for evaluation.   Vaginal Bleeding      Home Medications Prior to Admission medications   Medication Sig Start Date End Date Taking? Authorizing Provider  amoxicillin-clavulanate (AUGMENTIN) 875-125 MG tablet Take 1 tablet by mouth every 12 (twelve) hours. Patient not taking: Reported on 01/20/2023 02/27/22   Palumbo, April, MD  colchicine 0.6 MG tablet Take 1 tablet (0.6 mg total) by mouth 2 (two) times daily. Patient not taking: Reported on 01/20/2023 11/17/20   Maretta Bees, MD  diphenhydrAMINE (BENADRYL) 25 MG tablet Take 1 tablet (25 mg total) by mouth every 6 (six) hours as needed. Patient not taking: Reported on 01/20/2023 03/14/21   Virgina Norfolk, DO  LINZESS 72 MCG capsule Take 72 mcg by mouth daily as needed (for constipation).    [provider]  mesalamine (LIALDA) 1.2 g EC tablet Take 2 tablets (2.4 g total) by mouth daily with breakfast. Patient not taking: Reported on 01/20/2023 11/17/20 01/20/23  Maretta Bees, MD  ondansetron (ZOFRAN-ODT) 8 MG disintegrating tablet Take 8 mg by mouth 2 (two) times daily as needed for nausea or vomiting (dissolve orally). 12/31/22   [provider]  ondansetron (ZOFRAN-ODT) 8 MG disintegrating tablet 8mg  ODT q8 hours prn nausea 01/24/23   Palumbo, April, MD   oxyCODONE 10 MG TABS Take 1 tablet (10 mg total) by mouth every 8 (eight) hours as needed for up to 10 doses for breakthrough pain. Patient taking differently: Take 10 mg by mouth 3 (three) times daily. 03/22/22   Curatolo, Adam, DO  oxyCODONE-acetaminophen (PERCOCET) 5-325 MG tablet Take 1-2 tablets by mouth every 6 (six) hours as needed. Patient not taking: Reported on 01/20/2023 03/01/22   Geoffery Lyons, MD  pantoprazole (PROTONIX) 40 MG tablet Take 1 tablet (40 mg total) by mouth daily at 12 noon. Patient not taking: Reported on 01/20/2023 11/17/20   Maretta Bees, MD  predniSONE (DELTASONE) 20 MG tablet 3 Tabs PO Days 1-3, then 2 tabs PO Days 4-6, then 1 tab PO Day 7-9, then Half Tab PO Day 10-12 Patient not taking: Reported on 01/20/2023 03/01/22   Geoffery Lyons, MD  promethazine (PHENERGAN) 25 MG suppository Place 1 suppository (25 mg total) rectally every 8 (eight) hours as needed for nausea or vomiting. 01/20/23   Gloris Manchester, MD  promethazine (PHENERGAN) 25 MG tablet Take 1 tablet (25 mg total) by mouth every 8 (eight) hours as needed for nausea or vomiting. 01/20/23   Gloris Manchester, MD  Vitamin D, Ergocalciferol, (DRISDOL) 1.25 MG (50000 UNIT) CAPS capsule Take 50,000 Units by mouth See admin instructions. Take 50,000 units by mouth once a week if deficient    [provider]      Allergies    Acetaminophen, Prednisone, Avocado, Fentanyl, Hydrocodone-acetaminophen, Ibuprofen,  Naproxen sodium, and Morphine and codeine    Review of Systems   Review of Systems  Genitourinary:  Positive for vaginal bleeding.    Physical Exam Updated Vital Signs BP 113/76 (BP Location: Left Arm)   Pulse (!) 102   Temp 97.7 F (36.5 C)   Resp 18   Ht 5\' 5"  (1.651 m)   Wt 76.7 kg   LMP  (LMP Unknown)   SpO2 100%   Breastfeeding No   BMI 28.12 kg/m  Physical Exam Vitals and nursing note reviewed.  Constitutional:      General: She is not in acute distress.    Appearance: She is  well-developed. She is not diaphoretic.  HENT:     Head: Normocephalic and atraumatic.  Eyes:     Pupils: Pupils are equal, round, and reactive to light.  Cardiovascular:     Rate and Rhythm: Normal rate and regular rhythm.     Heart sounds: No murmur heard.    No friction rub. No gallop.  Pulmonary:     Effort: Pulmonary effort is normal.     Breath sounds: No wheezing or rales.  Abdominal:     General: There is no distension.     Palpations: Abdomen is soft.     Tenderness: There is abdominal tenderness.     Comments: Mild diffuse pain  Musculoskeletal:        General: No tenderness.     Cervical back: Normal range of motion and neck supple.  Skin:    General: Skin is warm and dry.  Neurological:     Mental Status: She is alert and oriented to person, place, and time.  Psychiatric:        Behavior: Behavior normal.     ED Results / Procedures / Treatments   Labs (all labs ordered are listed, but only abnormal results are displayed) Labs Reviewed  PREGNANCY, URINE - Abnormal; Notable for the following components:      Result Value   Preg Test, Ur POSITIVE (*)    All other components within normal limits  CBC - Abnormal; Notable for the following components:   Hemoglobin 11.9 (*)    MCV 75.7 (*)    MCH 23.7 (*)    RDW 15.8 (*)    All other components within normal limits  HCG, QUANTITATIVE, PREGNANCY    EKG None  Radiology No results found.  Procedures .Critical Care  Performed by: Melene Plan, DO Authorized by: Melene Plan, DO   Critical care provider statement:    Critical care time (minutes):  35   Critical care time was exclusive of:  Separately billable procedures and treating other patients   Critical care was time spent personally by me on the following activities:  Development of treatment plan with patient or surrogate, discussions with consultants, evaluation of patient's response to treatment, examination of patient, ordering and review of  laboratory studies, ordering and review of radiographic studies, ordering and performing treatments and interventions, pulse oximetry, re-evaluation of patient's condition and review of old charts   Care discussed with: admitting provider       Medications Ordered in ED Medications  acetaminophen (TYLENOL) tablet 1,000 mg (has no administration in time range)  oxyCODONE (Oxy IR/ROXICODONE) immediate release tablet 5 mg (has no administration in time range)    ED Course/ Medical Decision Making/ A&P  Medical Decision Making Amount and/or Complexity of Data Reviewed Labs: ordered.  Risk OTC drugs. Prescription drug management. Decision regarding hospitalization.   27 yo F [id number] with a chief complaints of vaginal bleeding.  Going on for about a month but worsening significantly over the past 48 hours or so.  She has had a 3 g drop in hemoglobin.  Her urine pregnancy test here is positive.  Will send off a quant.  We do not have ultrasound available.  I discussed with Hilda Lias, provider at the [facility].  Accepts the patient in transfer.  The patients results and plan were reviewed and discussed.   Any x-rays performed were independently reviewed by myself.   Differential diagnosis were considered with the presenting HPI.  Medications  acetaminophen (TYLENOL) tablet 1,000 mg (has no administration in time range)  oxyCODONE (Oxy IR/ROXICODONE) immediate release tablet 5 mg (has no administration in time range)    Vitals:   03/11/23 0020 03/11/23 0021  BP: 113/76   Pulse: (!) 102   Resp: 18   Temp: 97.7 F (36.5 C)   SpO2: 100%   Weight:  76.7 kg  Height:  5\' 5"  (1.651 m)    Final diagnoses:  Vaginal bleeding in pregnancy            Final Clinical Impression(s) / ED Diagnoses Final diagnoses:  Vaginal bleeding in pregnancy    Rx / DC Orders ED Discharge Orders     None         Melene Plan, DO 03/11/23 0330
